# Patient Record
Sex: Female | Born: 1960 | Race: White | Hispanic: No | Smoking: Never smoker
Health system: Southern US, Community
[De-identification: ages and names within clinical notes are randomized; demographics above are authoritative.]

## PROBLEM LIST (undated history)

## (undated) DIAGNOSIS — M810 Age-related osteoporosis without current pathological fracture: Secondary | ICD-10-CM

## (undated) DIAGNOSIS — A498 Other bacterial infections of unspecified site: Secondary | ICD-10-CM

## (undated) DIAGNOSIS — H25019 Cortical age-related cataract, unspecified eye: Secondary | ICD-10-CM

## (undated) DIAGNOSIS — M349 Systemic sclerosis, unspecified: Secondary | ICD-10-CM

## (undated) DIAGNOSIS — I7300 Raynaud's syndrome without gangrene: Secondary | ICD-10-CM

## (undated) DIAGNOSIS — J449 Chronic obstructive pulmonary disease, unspecified: Secondary | ICD-10-CM

## (undated) DIAGNOSIS — C801 Malignant (primary) neoplasm, unspecified: Secondary | ICD-10-CM

## (undated) DIAGNOSIS — M419 Scoliosis, unspecified: Secondary | ICD-10-CM

## (undated) DIAGNOSIS — C44329 Squamous cell carcinoma of skin of other parts of face: Secondary | ICD-10-CM

## (undated) DIAGNOSIS — I739 Peripheral vascular disease, unspecified: Secondary | ICD-10-CM

## (undated) DIAGNOSIS — J9610 Chronic respiratory failure, unspecified whether with hypoxia or hypercapnia: Secondary | ICD-10-CM

## (undated) DIAGNOSIS — F32A Depression, unspecified: Secondary | ICD-10-CM

## (undated) DIAGNOSIS — F419 Anxiety disorder, unspecified: Secondary | ICD-10-CM

## (undated) DIAGNOSIS — F329 Major depressive disorder, single episode, unspecified: Secondary | ICD-10-CM

## (undated) DIAGNOSIS — M1990 Unspecified osteoarthritis, unspecified site: Secondary | ICD-10-CM

## (undated) HISTORY — DX: Major depressive disorder, single episode, unspecified: F32.9

## (undated) HISTORY — DX: Chronic obstructive pulmonary disease, unspecified: J44.9

## (undated) HISTORY — DX: Unspecified osteoarthritis, unspecified site: M19.90

## (undated) HISTORY — DX: Scoliosis, unspecified: M41.9

## (undated) HISTORY — PX: SHOULDER ARTHROSCOPY: SHX128

## (undated) HISTORY — DX: Peripheral vascular disease, unspecified: I73.9

## (undated) HISTORY — DX: Depression, unspecified: F32.A

## (undated) HISTORY — DX: Chronic respiratory failure, unspecified whether with hypoxia or hypercapnia: J96.10

## (undated) HISTORY — DX: Raynaud's syndrome without gangrene: I73.00

## (undated) HISTORY — PX: OTHER SURGICAL HISTORY: SHX169

## (undated) HISTORY — PX: TOOTH EXTRACTION: SUR596

## (undated) HISTORY — DX: Anxiety disorder, unspecified: F41.9

---

## 2000-12-07 ENCOUNTER — Other Ambulatory Visit: Admission: RE | Admit: 2000-12-07 | Discharge: 2000-12-07 | Payer: Self-pay | Admitting: *Deleted

## 2007-10-25 ENCOUNTER — Emergency Department: Payer: Self-pay | Admitting: Emergency Medicine

## 2007-11-02 ENCOUNTER — Emergency Department: Payer: Self-pay | Admitting: Unknown Physician Specialty

## 2013-04-03 ENCOUNTER — Ambulatory Visit: Payer: Self-pay

## 2015-07-30 ENCOUNTER — Ambulatory Visit (INDEPENDENT_AMBULATORY_CARE_PROVIDER_SITE_OTHER): Payer: PRIVATE HEALTH INSURANCE | Admitting: Sports Medicine

## 2015-07-30 ENCOUNTER — Encounter: Payer: Self-pay | Admitting: Sports Medicine

## 2015-07-30 ENCOUNTER — Ambulatory Visit: Payer: Self-pay | Admitting: Sports Medicine

## 2015-07-30 DIAGNOSIS — L603 Nail dystrophy: Secondary | ICD-10-CM | POA: Diagnosis not present

## 2015-07-30 DIAGNOSIS — M79675 Pain in left toe(s): Secondary | ICD-10-CM | POA: Diagnosis not present

## 2015-07-30 NOTE — Patient Instructions (Signed)

## 2015-07-30 NOTE — Progress Notes (Signed)
Patient ID: Sophia Bean, female   DOB: Aug 26, 1960, 55 y.o.   MRN: [id number]  Subjective: Sophia Bean is a 55 y.o. female patient presents to office today complaining of a painful thickened left big toenail that is painful in nature and severely deformed secondary to stubbing the toe multiple years ago and also on hitting the toe in shoe. Patient has tried trimming with no improvement. Patient denies signs of infection or drainage around nail. Patient denies fever/chills/nausea/vomitting/any other related constitutional symptoms at this time.  There are no active problems to display for this patient.   No current outpatient prescriptions on file prior to visit.   No current facility-administered medications on file prior to visit.    Allergies  Allergen Reactions  . Morphine And Related   . Penicillins     Objective:  There were no vitals filed for this visit.  General: Well developed, nourished, in no acute distress, alert and oriented x3   Dermatology: Skin is warm, dry and supple bilateral.Left hallux nail appears to be severely thickened with multiple trauma lines and discoloration with a ram's horn appearance of the nail. (-) Erythema. (-) Edema. (-) serosanguous  drainage present. The remaining nails appear unremarkable at this time. There are no open sores, lesions or other signs of infection present.  Vascular: Dorsalis Pedis artery 1/4 and Posterior Tibial artery pedal pulses are 2/4 bilateral with immedate capillary fill time. Scant pedal hair growth present. No lower extremity edema. + Varicosities bilateral.   Neruologic: Grossly intact via light touch bilateral.  Musculoskeletal: Tenderness to palpation of the Left hallux nail. Muscular strength within normal limits in all groups bilateral.   Assesement and Plan: Problem List Items Addressed This Visit    None    Visit Diagnoses    Nail dystrophy    -  Primary    Toe pain, left           -Discussed treatment  alternatives and plan of care; Explained permanent/temporary nail avulsion and post procedure course to patient. Patient opts for temporary total removal of left hallux nail. - After a verbal consent, injected 3 ml of a 50:50 mixture of 2% plain  lidocaine and 0.5% plain marcaine in a normal hallux block fashion. Next, a  betadine prep was performed. Anesthesia was tested and found to be appropriate.  The offending Left hallux nail was then incised from the hyponychium to the epinychium. The nail was removed and cleared from the field. The area was then flushed with alcohol and dressed with antibiotic cream and a dry sterile dressing. -Patient was instructed to leave the dressing intact for today and begin soaking  in a weak solution of betadine and water tomorrow. Patient was instructed to  soak for 15 minutes each day and apply neosporin and a gauze or bandaid dressing each day. -Patient was instructed to monitor the toe for signs of infection and return to office if toe becomes red, hot or swollen. -Patient is to return in 1 week for follow up care or sooner if problems arise.  Landis Martins, DPM

## 2015-08-06 ENCOUNTER — Ambulatory Visit (INDEPENDENT_AMBULATORY_CARE_PROVIDER_SITE_OTHER): Payer: Medicare Other | Admitting: Sports Medicine

## 2015-08-06 ENCOUNTER — Encounter: Payer: Self-pay | Admitting: Sports Medicine

## 2015-08-06 DIAGNOSIS — M79675 Pain in left toe(s): Secondary | ICD-10-CM

## 2015-08-06 DIAGNOSIS — L603 Nail dystrophy: Secondary | ICD-10-CM

## 2015-08-06 NOTE — Progress Notes (Signed)
Patient ID: Sophia Bean, female   DOB: 03-Aug-1960, 55 y.o.   MRN: [id number] Subjective: Sophia Bean is a 55 y.o. female patient returns to office today for follow up evaluation after having Left Hallux total temporary nail avulsion performed on 07-30-15. Patient has been soaking using betadine and applying topical antibiotic covered with bandaid daily. Patient denies fever/chills/nausea/vomitting/any other related constitutional symptoms at this time.  There are no active problems to display for this patient.   Current Outpatient Prescriptions on File Prior to Visit  Medication Sig Dispense Refill  . levofloxacin (LEVAQUIN) 500 MG tablet Take 500 mg by mouth daily.  0  . LORazepam (ATIVAN) 0.5 MG tablet Take 0.5 mg by mouth daily as needed.  0  . oxyCODONE-acetaminophen (PERCOCET/ROXICET) 5-325 MG tablet TAKE 1 TABLET BY MOUTH EVERY DAY TO TWICE A DAY AS NEEDED  0   No current facility-administered medications on file prior to visit.    Allergies  Allergen Reactions  . Morphine And Related   . Penicillins     Objective:  General: Well developed, nourished, in no acute distress, alert and oriented x3   Dermatology: Skin is warm, dry and supple bilateral. Left hallux nail bed appears to be clean, dry, with mild granular tissue and surrounding eschar/scab. (-) Erythema. (-) Edema. (-) serosanguous drainage present. The remaining nails appear unremarkable at this time. There are no other lesions or other signs of infection  present.  Neurovascular status: Intact. No lower extremity swelling; No pain with calf compression bilateral.  Musculoskeletal: Decreased tenderness to palpation of the Left hallux nailbed. Muscular strength within normal limits bilateral.   Assesement and Plan: Problem List Items Addressed This Visit    None    Visit Diagnoses    Nail dystrophy    -  Primary    S/P Temp Total Nail Avulsion Left hallux 07-30-15    Toe pain, left           -Examined patient   -Cleansed left hallux nail bed and gently scrubbed with peroxide and  applied antibiotic cream covered with bandaid.  -Discussed plan of care with patient. -Patient to now begin soaking in a weak solution of Epsom salt and warm water. Patient was instructed to soak for 15-20 minutes each day until the toe appears normal and there is no drainage, redness, tenderness, or swelling at the procedure site, and apply neosporin and a gauze or bandaid dressing each day as needed. May leave open to air at night. -Educated patient on long term care after nail surgery. -Patient was instructed to monitor the toe for reoccurrence and signs of infection; Patient advised to return to office if toe becomes red, hot or swollen. -Patient is to return as needed or sooner if problems arise.  Sophia Bean, DPM

## 2015-08-12 ENCOUNTER — Other Ambulatory Visit: Payer: Self-pay | Admitting: Internal Medicine

## 2015-08-12 DIAGNOSIS — Z1231 Encounter for screening mammogram for malignant neoplasm of breast: Secondary | ICD-10-CM

## 2015-08-26 ENCOUNTER — Ambulatory Visit: Payer: Self-pay

## 2015-08-27 ENCOUNTER — Other Ambulatory Visit: Payer: Self-pay | Admitting: Internal Medicine

## 2015-08-27 ENCOUNTER — Ambulatory Visit
Admission: RE | Admit: 2015-08-27 | Discharge: 2015-08-27 | Disposition: A | Discharge status: Home / Self Care | Payer: Medicare Other | Source: Ambulatory Visit | Attending: Internal Medicine | Admitting: Internal Medicine

## 2015-08-27 DIAGNOSIS — Z1231 Encounter for screening mammogram for malignant neoplasm of breast: Secondary | ICD-10-CM

## 2015-08-27 HISTORY — DX: Malignant (primary) neoplasm, unspecified: C80.1

## 2015-08-31 ENCOUNTER — Other Ambulatory Visit: Payer: Self-pay | Admitting: Internal Medicine

## 2015-08-31 DIAGNOSIS — R928 Other abnormal and inconclusive findings on diagnostic imaging of breast: Secondary | ICD-10-CM

## 2015-09-10 ENCOUNTER — Ambulatory Visit
Admission: RE | Admit: 2015-09-10 | Discharge: 2015-09-10 | Disposition: A | Discharge status: Home / Self Care | Payer: Medicare Other | Source: Ambulatory Visit | Attending: Internal Medicine | Admitting: Internal Medicine

## 2015-09-10 DIAGNOSIS — R928 Other abnormal and inconclusive findings on diagnostic imaging of breast: Secondary | ICD-10-CM | POA: Diagnosis not present

## 2015-09-10 DIAGNOSIS — N6001 Solitary cyst of right breast: Secondary | ICD-10-CM | POA: Insufficient documentation

## 2016-07-18 ENCOUNTER — Other Ambulatory Visit: Payer: Self-pay | Admitting: Internal Medicine

## 2016-07-18 DIAGNOSIS — M341 CR(E)ST syndrome: Secondary | ICD-10-CM

## 2016-07-27 ENCOUNTER — Ambulatory Visit
Admission: RE | Admit: 2016-07-27 | Discharge: 2016-07-27 | Disposition: A | Discharge status: Home / Self Care | Payer: Medicare Other | Source: Ambulatory Visit | Attending: Internal Medicine | Admitting: Internal Medicine

## 2016-07-27 DIAGNOSIS — M3481 Systemic sclerosis with lung involvement: Secondary | ICD-10-CM | POA: Diagnosis not present

## 2016-07-27 DIAGNOSIS — M341 CR(E)ST syndrome: Secondary | ICD-10-CM | POA: Diagnosis not present

## 2016-07-27 DIAGNOSIS — K769 Liver disease, unspecified: Secondary | ICD-10-CM | POA: Diagnosis not present

## 2016-07-27 DIAGNOSIS — J849 Interstitial pulmonary disease, unspecified: Secondary | ICD-10-CM | POA: Diagnosis not present

## 2016-07-27 DIAGNOSIS — I700 Atherosclerosis of aorta: Secondary | ICD-10-CM | POA: Insufficient documentation

## 2016-07-27 DIAGNOSIS — J439 Emphysema, unspecified: Secondary | ICD-10-CM | POA: Insufficient documentation

## 2016-08-02 ENCOUNTER — Other Ambulatory Visit: Payer: Self-pay | Admitting: Internal Medicine

## 2016-08-02 DIAGNOSIS — K769 Liver disease, unspecified: Secondary | ICD-10-CM

## 2016-08-04 ENCOUNTER — Ambulatory Visit
Admission: RE | Admit: 2016-08-04 | Discharge: 2016-08-04 | Disposition: A | Discharge status: Home / Self Care | Payer: Medicare Other | Source: Ambulatory Visit | Attending: Internal Medicine | Admitting: Internal Medicine

## 2016-08-04 DIAGNOSIS — K769 Liver disease, unspecified: Secondary | ICD-10-CM | POA: Diagnosis present

## 2016-08-04 DIAGNOSIS — K824 Cholesterolosis of gallbladder: Secondary | ICD-10-CM | POA: Diagnosis not present

## 2016-08-14 ENCOUNTER — Other Ambulatory Visit: Payer: Self-pay | Admitting: Internal Medicine

## 2016-08-14 DIAGNOSIS — Z1231 Encounter for screening mammogram for malignant neoplasm of breast: Secondary | ICD-10-CM

## 2016-09-14 ENCOUNTER — Ambulatory Visit: Payer: Medicare Other

## 2016-11-02 ENCOUNTER — Ambulatory Visit
Admission: RE | Admit: 2016-11-02 | Discharge: 2016-11-02 | Disposition: A | Discharge status: Home / Self Care | Payer: Medicare Other | Source: Ambulatory Visit | Attending: Internal Medicine | Admitting: Internal Medicine

## 2016-11-02 DIAGNOSIS — Z1231 Encounter for screening mammogram for malignant neoplasm of breast: Secondary | ICD-10-CM | POA: Diagnosis not present

## 2017-02-19 ENCOUNTER — Other Ambulatory Visit: Payer: Self-pay | Admitting: Student

## 2017-02-19 DIAGNOSIS — D1803 Hemangioma of intra-abdominal structures: Secondary | ICD-10-CM

## 2017-02-27 ENCOUNTER — Ambulatory Visit
Admission: RE | Admit: 2017-02-27 | Discharge: 2017-02-27 | Disposition: A | Discharge status: Home / Self Care | Payer: Medicare Other | Source: Ambulatory Visit | Attending: Student | Admitting: Student

## 2017-02-27 DIAGNOSIS — D1803 Hemangioma of intra-abdominal structures: Secondary | ICD-10-CM | POA: Diagnosis not present

## 2017-02-27 MED ORDER — GADOBENATE DIMEGLUMINE 529 MG/ML IV SOLN
13.0000 mL | Freq: Once | INTRAVENOUS | Status: AC | PRN
  Administered 2017-02-27: 13 mL via INTRAVENOUS

## 2017-06-26 ENCOUNTER — Encounter (INDEPENDENT_AMBULATORY_CARE_PROVIDER_SITE_OTHER): Payer: Medicare Other | Admitting: Vascular Surgery

## 2017-07-09 ENCOUNTER — Encounter (INDEPENDENT_AMBULATORY_CARE_PROVIDER_SITE_OTHER): Payer: Self-pay | Admitting: Vascular Surgery

## 2017-07-09 ENCOUNTER — Ambulatory Visit (INDEPENDENT_AMBULATORY_CARE_PROVIDER_SITE_OTHER): Payer: Medicare Other | Admitting: Vascular Surgery

## 2017-07-09 VITALS — BP 116/66 | HR 95 | Resp 16 | Ht 67.0 in | Wt 135.0 lb

## 2017-07-09 DIAGNOSIS — L97309 Non-pressure chronic ulcer of unspecified ankle with unspecified severity: Secondary | ICD-10-CM

## 2017-07-09 DIAGNOSIS — I83003 Varicose veins of unspecified lower extremity with ulcer of ankle: Secondary | ICD-10-CM | POA: Insufficient documentation

## 2017-07-09 DIAGNOSIS — R600 Localized edema: Secondary | ICD-10-CM | POA: Insufficient documentation

## 2017-07-09 DIAGNOSIS — I83813 Varicose veins of bilateral lower extremities with pain: Secondary | ICD-10-CM | POA: Diagnosis not present

## 2017-07-09 NOTE — Progress Notes (Signed)
Subjective:    Patient ID: Sophia Bean, female    DOB: 03/20/1961, 57 y.o.   MRN: [id number] Chief Complaint  Patient presents with  . New Patient (Initial Visit)    PVD   Presents as a new patient referred by Dr. Renard Hamper for evaluation of venous insufficiency.  The patient endorses a history of being diagnosed with bilateral venous insufficiency in 2016 by a physician at [facility].  Since that time, the patient has been experiencing ulcerations located to the bilateral medial ankles on an intermittent basis.  The patient treats these "ulcerations" with silvadene.  This seems to work well.  The patient also wears 15-10mmhg knee-high compression stockings on a daily basis.  The patient also elevates her legs heart level or higher.  Patient has a past medical history of crest syndrome.  The patient endorses a hard to heal incisions and wounds.  The patient is looking to establish care at a practice which is closer than [facility].  The patient also experiences mild bilateral lower extremity edema.  The patient notes discomfort along her varicosities.  Her symptoms have progressed to the point she is unable to function on a daily basis.  Her symptoms have become lifestyle limiting.  The patient is worried if she develops any additional/worsening ulcerations due to her crest syndrome she will not be able to heal them.  The patient denies any recent trauma surgery to the bilateral lower extremity.  The patient denies any DVT history to the bilateral lower extremity.  The patient denies any active ulceration to the bilateral lower extremity.  The patient denies any fever, nausea vomiting.   Review of Systems  Constitutional: Negative.   HENT: Negative.   Eyes: Negative.   Respiratory: Negative.   Cardiovascular: Positive for leg swelling.       Painful varicose veins Venous ulceration  Gastrointestinal: Negative.   Endocrine: Negative.   Genitourinary: Negative.   Musculoskeletal: Negative.     Skin: Negative.   Allergic/Immunologic: Negative.   Neurological: Negative.   Hematological: Negative.   Psychiatric/Behavioral: Negative.       Objective:   Physical Exam  Constitutional: She is oriented to person, place, and time. She appears well-developed and well-nourished. No distress.  HENT:  Head: Normocephalic and atraumatic.  Eyes: Conjunctivae are normal. Pupils are equal, round, and reactive to light.  Neck: Normal range of motion.  Cardiovascular: Normal rate, regular rhythm, normal heart sounds and intact distal pulses.  Pulses:      Radial pulses are 2+ on the right side, and 2+ on the left side.       Dorsalis pedis pulses are 1+ on the right side, and 1+ on the left side.       Posterior tibial pulses are 2+ on the right side, and 2+ on the left side.  Pulmonary/Chest: Effort normal and breath sounds normal.  Musculoskeletal: Normal range of motion. She exhibits edema (Minimal to mild nonpitting bilateral edema noted).  Neurological: She is alert and oriented to person, place, and time.  Skin: She is not diaphoretic.  There is severe stasis dermatitis noted to the patient's bilateral medial ankle area.  There is no active cellulitis.  There is no active ulcerations.  Is a mixture of greater than 1 cm and less than 1 cm varicosities noted to the bilateral lower extremity  Psychiatric: She has a normal mood and affect. Her behavior is normal. Judgment and thought content normal.  Vitals reviewed.  BP 116/66 (BP  Location: Right Arm, Patient Position: Sitting)   Pulse 95   Resp 16   Ht 5\' 7"  (1.702 m)   Wt 135 lb (61.2 kg)   LMP  (LMP Unknown)   BMI 21.14 kg/m   Past Medical History:  Diagnosis Date  . Anxiety   . Arthritis   . Cancer ([place])    skin  . Chronic respiratory failure ([place])   . COPD (chronic obstructive pulmonary disease) ([place])   . Depression   . Peripheral vascular disease ([place])   . Raynaud disease   . Scoliosis    Social History    Socioeconomic History  . Marital status: Divorced    Spouse name: Not on file  . Number of children: Not on file  . Years of education: Not on file  . Highest education level: Not on file  Social Needs  . Financial resource strain: Not on file  . Food insecurity - worry: Not on file  . Food insecurity - inability: Not on file  . Transportation needs - medical: Not on file  . Transportation needs - non-medical: Not on file  Occupational History  . Not on file  Tobacco Use  . Smoking status: Never Smoker  . Smokeless tobacco: Never Used  Substance and Sexual Activity  . Alcohol use: No    Alcohol/week: 0.0 oz    Frequency: Never  . Drug use: Not on file  . Sexual activity: Not on file  Other Topics Concern  . Not on file  Social History Narrative  . Not on file   Past Surgical History:  Procedure Laterality Date  . mohs Right   . SHOULDER ARTHROSCOPY     times 9   Family History  Problem Relation Age of Onset  . Heart disease Mother   . Hypertension Mother   . Cancer Mother   . Cancer Father   . Cancer Maternal Grandmother   . Heart disease Maternal Grandmother   . Breast cancer Neg Hx    Allergies  Allergen Reactions  . Flonase [Fluticasone] Shortness Of Breath and Rash  . Morphine And Related   . Penicillins   . Duloxetine Hcl Nausea Only and Palpitations      Assessment & Plan:  Presents as a new patient referred by Dr. Renard Hamper for evaluation of venous insufficiency.  The patient endorses a history of being diagnosed with bilateral venous insufficiency in 2016 by a physician at [facility].  Since that time, the patient has been experiencing ulcerations located to the bilateral medial ankles on an intermittent basis.  The patient treats these "ulcerations" with silvadene.  This seems to work well.  The patient also wears 15-16mmhg knee-high compression stockings on a daily basis.  The patient also elevates her legs heart level or higher.  Patient has a past  medical history of crest syndrome.  The patient endorses a hard to heal incisions and wounds.  The patient is looking to establish care at a practice which is closer than [facility].  The patient also experiences mild bilateral lower extremity edema.  The patient notes discomfort along her varicosities.  Her symptoms have progressed to the point she is unable to function on a daily basis.  Her symptoms have become lifestyle limiting.  The patient is worried if she develops any additional/worsening ulcerations due to her crest syndrome she will not be able to heal them.  The patient denies any recent trauma surgery to the bilateral lower extremity.  The patient denies  any DVT history to the bilateral lower extremity.  The patient denies any active ulceration to the bilateral lower extremity.  The patient denies any fever, nausea vomiting.  1. Varicose veins of both lower extremities with pain - New Patient has a past medical history of being diagnosed in 2016 with bilateral venous insufficiency by physician at [facility]. I think it would benefit the patient to increase her medical compression stockings to 20-70mmhg Patient to place her stockings in the a.m. and remove them in the p.m.  There is no need to sleep in the stockings. Patient was encouraged to elevate her legs heart level or higher I will bring the patient back in approximately 1 month to undergo a bilateral venous duplex to assess her anatomy and degree of venous insufficiency At that time, I can assess her progress with medical grade 1 compression stockings and elevation. We can always discussed the addition of a lymphedema pump at that time.  - VAS [place] LOWER EXTREMITY VENOUS REFLUX; Future  2. Venous stasis ulcer of ankle with varicose veins, unspecified laterality, unspecified ulcer stage ([place]) - New The patient does not have any active ulcerations at this time however has dealt with them in the past I will order an ABI to assess the  patient's arterial blood flow  - VAS [place] ABI WITH/WO TBI; Future  3. Bilateral lower extremity edema  - New As above  Current Outpatient Medications on File Prior to Visit  Medication Sig Dispense Refill  . alendronate (FOSAMAX) 70 MG tablet Take by mouth.    . Calcium Carbonate-Vitamin D (CALCIUM HIGH POTENCY/VITAMIN D) 600-200 MG-UNIT TABS Take by mouth.    . Cholecalciferol (VITAMIN D-1000 MAX ST) 1000 units tablet Take by mouth.    . gabapentin (NEURONTIN) 300 MG capsule TAKE 1 CAPSULE BY MOUTH EVERY 12 HOURS  2  . gentamicin ointment (GARAMYCIN) 0.1 % Apply topically.    Marland Kitchen LORazepam (ATIVAN) 0.5 MG tablet Take 0.5 mg by mouth daily as needed.  0  . mycophenolate (CELLCEPT) 500 MG tablet Take by mouth.    . nitroGLYCERIN (NITRO-BID) 2 % ointment Place onto the skin.    Marland Kitchen oxyCODONE-acetaminophen (PERCOCET/ROXICET) 5-325 MG tablet TAKE 1 TABLET BY MOUTH EVERY DAY TO TWICE A DAY AS NEEDED  0  . ranitidine (ZANTAC) 150 MG tablet Take by mouth.    . silver sulfADIAZINE (SILVADENE) 1 % cream Apply topically.    Marland Kitchen levofloxacin (LEVAQUIN) 500 MG tablet Take 500 mg by mouth daily.  0   No current facility-administered medications on file prior to visit.    There are no Patient Instructions on file for this visit. No Follow-up on file.  Jmya Uliano A Laurine Kuyper, PA-C

## 2017-07-10 ENCOUNTER — Telehealth (INDEPENDENT_AMBULATORY_CARE_PROVIDER_SITE_OTHER): Payer: Self-pay | Admitting: Vascular Surgery

## 2017-07-10 NOTE — Telephone Encounter (Signed)
New Message  Pt was here to see KS and she told KS the wrong compression about her stockings.  Pt verbalized she is suppose to have or be in 30-40 the next size up.

## 2017-07-10 NOTE — Telephone Encounter (Signed)
I spoke with the patient and she will be coming by the office to receive a new rx for 30-21mmHg compressions stocking

## 2017-07-31 ENCOUNTER — Other Ambulatory Visit: Payer: Self-pay | Admitting: Student

## 2017-07-31 ENCOUNTER — Ambulatory Visit
Admission: RE | Admit: 2017-07-31 | Discharge: 2017-07-31 | Disposition: A | Discharge status: Home / Self Care | Payer: Medicare Other | Source: Ambulatory Visit | Attending: Student | Admitting: Student

## 2017-07-31 DIAGNOSIS — K219 Gastro-esophageal reflux disease without esophagitis: Secondary | ICD-10-CM

## 2017-08-06 ENCOUNTER — Ambulatory Visit (INDEPENDENT_AMBULATORY_CARE_PROVIDER_SITE_OTHER): Payer: Medicare Other | Admitting: Vascular Surgery

## 2017-08-06 ENCOUNTER — Encounter (INDEPENDENT_AMBULATORY_CARE_PROVIDER_SITE_OTHER): Payer: Medicare Other

## 2017-08-07 ENCOUNTER — Ambulatory Visit
Admission: RE | Admit: 2017-08-07 | Discharge: 2017-08-07 | Disposition: A | Discharge status: Home / Self Care | Payer: Medicare Other | Source: Ambulatory Visit | Attending: Student | Admitting: Student

## 2017-08-07 DIAGNOSIS — K219 Gastro-esophageal reflux disease without esophagitis: Secondary | ICD-10-CM | POA: Diagnosis present

## 2017-08-29 ENCOUNTER — Ambulatory Visit (INDEPENDENT_AMBULATORY_CARE_PROVIDER_SITE_OTHER): Payer: Medicare Other | Admitting: Vascular Surgery

## 2017-08-29 ENCOUNTER — Encounter (INDEPENDENT_AMBULATORY_CARE_PROVIDER_SITE_OTHER): Payer: Medicare Other

## 2017-09-10 ENCOUNTER — Other Ambulatory Visit: Payer: Self-pay | Admitting: Nurse Practitioner

## 2017-09-10 ENCOUNTER — Other Ambulatory Visit (HOSPITAL_COMMUNITY): Payer: Self-pay | Admitting: Anesthesiology

## 2017-09-10 DIAGNOSIS — M545 Low back pain: Secondary | ICD-10-CM

## 2017-09-13 ENCOUNTER — Ambulatory Visit (HOSPITAL_COMMUNITY)
Admission: RE | Admit: 2017-09-13 | Discharge: 2017-09-13 | Disposition: A | Discharge status: Home / Self Care | Payer: Medicare Other | Source: Ambulatory Visit | Attending: Nurse Practitioner | Admitting: Nurse Practitioner

## 2017-09-13 DIAGNOSIS — G893 Neoplasm related pain (acute) (chronic): Secondary | ICD-10-CM | POA: Insufficient documentation

## 2017-09-13 DIAGNOSIS — M48061 Spinal stenosis, lumbar region without neurogenic claudication: Secondary | ICD-10-CM | POA: Diagnosis not present

## 2017-09-13 DIAGNOSIS — M545 Low back pain: Secondary | ICD-10-CM | POA: Diagnosis present

## 2017-09-17 ENCOUNTER — Other Ambulatory Visit (INDEPENDENT_AMBULATORY_CARE_PROVIDER_SITE_OTHER): Payer: Self-pay | Admitting: Vascular Surgery

## 2017-09-17 ENCOUNTER — Ambulatory Visit (INDEPENDENT_AMBULATORY_CARE_PROVIDER_SITE_OTHER): Payer: Medicare Other

## 2017-09-17 ENCOUNTER — Encounter (INDEPENDENT_AMBULATORY_CARE_PROVIDER_SITE_OTHER): Payer: Medicare Other

## 2017-09-17 ENCOUNTER — Ambulatory Visit (INDEPENDENT_AMBULATORY_CARE_PROVIDER_SITE_OTHER): Payer: Medicare Other | Admitting: Vascular Surgery

## 2017-09-17 ENCOUNTER — Encounter (INDEPENDENT_AMBULATORY_CARE_PROVIDER_SITE_OTHER): Payer: Self-pay | Admitting: Vascular Surgery

## 2017-09-17 VITALS — BP 101/62 | HR 71 | Resp 16 | Ht 67.0 in | Wt 125.0 lb

## 2017-09-17 DIAGNOSIS — I739 Peripheral vascular disease, unspecified: Secondary | ICD-10-CM

## 2017-09-17 DIAGNOSIS — I890 Lymphedema, not elsewhere classified: Secondary | ICD-10-CM

## 2017-09-17 DIAGNOSIS — I83003 Varicose veins of unspecified lower extremity with ulcer of ankle: Secondary | ICD-10-CM | POA: Diagnosis not present

## 2017-09-17 DIAGNOSIS — R0989 Other specified symptoms and signs involving the circulatory and respiratory systems: Secondary | ICD-10-CM

## 2017-09-17 DIAGNOSIS — I872 Venous insufficiency (chronic) (peripheral): Secondary | ICD-10-CM

## 2017-09-17 DIAGNOSIS — L97309 Non-pressure chronic ulcer of unspecified ankle with unspecified severity: Secondary | ICD-10-CM

## 2017-09-17 DIAGNOSIS — I83813 Varicose veins of bilateral lower extremities with pain: Secondary | ICD-10-CM | POA: Diagnosis not present

## 2017-09-17 DIAGNOSIS — R600 Localized edema: Secondary | ICD-10-CM

## 2017-09-23 ENCOUNTER — Encounter (INDEPENDENT_AMBULATORY_CARE_PROVIDER_SITE_OTHER): Payer: Self-pay | Admitting: Vascular Surgery

## 2017-09-23 DIAGNOSIS — I890 Lymphedema, not elsewhere classified: Secondary | ICD-10-CM | POA: Insufficient documentation

## 2017-09-23 DIAGNOSIS — I872 Venous insufficiency (chronic) (peripheral): Secondary | ICD-10-CM | POA: Insufficient documentation

## 2017-09-23 NOTE — Progress Notes (Signed)
MRN : [id number]  Sophia Bean is a 57 y.o. (February 09, 1961) female who presents with chief complaint of  Chief Complaint  Patient presents with  . Follow-up    1 month venous reflux, ABI  .  History of Present Illness: Patient is seen for follow up evaluation of leg pain and swelling associated with venous stasis disease.  The swelling abruptly became much worse bilaterally and is associated with pain and discoloration. The pain and swelling worsens with prolonged dependency and improves with elevation.  The patient notes that in the morning the legs are better but the leg symptoms worsened throughout the course of the day. The patient has also noted a progressive worsening of the discoloration in the ankle and shin area.   The patient notes that the discoloration has developed slowly without specific trauma.    The patient states that they have been elevating as much as possible. The patient denies any recent changes in medications.  The patient denies a history of DVT or PE. There is no prior history of phlebitis. There is no history of primary lymphedema.  No SOB or increased cough.  No sputum production.  No recent episodes of CHF exacerbation.   Current Meds  Medication Sig  . amlodipine-atorvastatin (CADUET) 10-20 MG tablet Take 1 tablet by mouth daily.  . Calcium Carbonate-Vitamin D (CALCIUM HIGH POTENCY/VITAMIN D) 600-200 MG-UNIT TABS Take by mouth.  . Cholecalciferol (VITAMIN D-1000 MAX ST) 1000 units tablet Take by mouth.  . fentaNYL (DURAGESIC - DOSED MCG/HR) 12 MCG/HR APPLY 1 PATCH EVERY 48 HOURS  . gabapentin (NEURONTIN) 300 MG capsule TAKE 1 CAPSULE BY MOUTH EVERY 12 HOURS  . mycophenolate (CELLCEPT) 500 MG tablet Take by mouth.  . pantoprazole (PROTONIX) 40 MG tablet TAKE 1 TABLET ORALLY DAILY IN THE AM 1 HOUR PRIOR TO BREAKFAST  . ranitidine (ZANTAC) 150 MG tablet Take by mouth.  . zoledronic acid (RECLAST) 5 MG/100ML SOLN injection Inject 5 mg into the vein once.     Past Medical History:  Diagnosis Date  . Anxiety   . Arthritis   . Cancer ([place])    skin  . Chronic respiratory failure ([place])   . COPD (chronic obstructive pulmonary disease) ([place])   . Depression   . Peripheral vascular disease ([place])   . Raynaud disease   . Scoliosis     Past Surgical History:  Procedure Laterality Date  . mohs Right   . SHOULDER ARTHROSCOPY     times 9    Social History Social History   Tobacco Use  . Smoking status: Never Smoker  . Smokeless tobacco: Never Used  Substance Use Topics  . Alcohol use: No    Alcohol/week: 0.0 oz    Frequency: Never  . Drug use: Not on file    Family History Family History  Problem Relation Age of Onset  . Heart disease Mother   . Hypertension Mother   . Cancer Mother   . Cancer Father   . Cancer Maternal Grandmother   . Heart disease Maternal Grandmother   . Breast cancer Neg Hx   No family history of bleeding/clotting disorders, porphyria or autoimmune disease   Allergies  Allergen Reactions  . Flonase [Fluticasone] Shortness Of Breath and Rash  . Minocycline     Other reaction(s): Other (See Comments) She has not been able to eat, has had severe acid reflux, has had some chest pain, and has also had breathing problems.  . Morphine And Related   .  Penicillins   . Duloxetine Hcl Nausea Only and Palpitations     REVIEW OF SYSTEMS (Negative unless checked)  Constitutional: [] Weight loss  [] Fever  [] Chills Cardiac: [] Chest pain   [] Chest pressure   [] Palpitations   [] Shortness of breath when laying flat   [] Shortness of breath with exertion. Vascular:  [] Pain in legs with walking   [] Pain in legs at rest  [] History of DVT   [] Phlebitis   [x] Swelling in legs   [x] Varicose veins   [] Non-healing ulcers Pulmonary:   [] Uses home oxygen   [] Productive cough   [] Hemoptysis   [] Wheeze  [] COPD   [] Asthma Neurologic:  [] Dizziness   [] Seizures   [] History of stroke   [] History of TIA  [] Aphasia   [] Vissual  changes   [] Weakness or numbness in arm   [] Weakness or numbness in leg Musculoskeletal:   [] Joint swelling   [] Joint pain   [] Low back pain Hematologic:  [] Easy bruising  [] Easy bleeding   [] Hypercoagulable state   [] Anemic Gastrointestinal:  [] Diarrhea   [] Vomiting  [] Gastroesophageal reflux/heartburn   [] Difficulty swallowing. Genitourinary:  [] Chronic kidney disease   [] Difficult urination  [] Frequent urination   [] Blood in urine Skin:  [x] Rashes   [x] Ulcers  Psychological:  [] History of anxiety   []  History of major depression.  Physical Examination  Vitals:   09/17/17 1458  BP: 101/62  Pulse: 71  Resp: 16  Weight: 125 lb (56.7 kg)  Height: 5\' 7"  (1.702 m)   Body mass index is 19.58 kg/m. Gen: WD/WN, NAD Head: [place]/AT, No temporalis wasting.  Ear/Nose/Throat: Hearing grossly intact, nares w/o erythema or drainage, poor dentition Eyes: PER, EOMI, sclera nonicteric.  Neck: Supple, no masses.  No bruit or JVD.  Pulmonary:  Good air movement, clear to auscultation bilaterally, no use of accessory muscles.  Cardiac: RRR, normal S1, S2, no Murmurs. Vascular: Large varicosities present extensively greater than 10 mm bilateral legs.  Severe venous stasis changes to the legs bilaterally.  4+ soft pitting edema Vessel Right Left  Radial Palpable Palpable  PT Palpable Palpable  DP Palpable Palpable  Gastrointestinal: soft, non-distended. No guarding/no peritoneal signs.  Musculoskeletal: M/S 5/5 throughout.  No deformity or atrophy.  Neurologic: CN 2-12 intact. Pain and light touch intact in extremities.  Symmetrical.  Speech is fluent. Motor exam as listed above. Psychiatric: Judgment intact, Mood & affect appropriate for pt's clinical situation. Dermatologic: Severe rashes no ulcers noted.  No changes consistent with cellulitis. Lymph : No Cervical lymphadenopathy, no lichenification or skin changes of chronic lymphedema.  CBC No results found for: WBC, HGB, HCT, MCV,  PLT  BMET No results found for: NA, K, CL, CO2, GLUCOSE, BUN, CREATININE, CALCIUM, GFRNONAA, GFRAA CrCl cannot be calculated (No order found.).  COAG No results found for: INR, PROTIME  Radiology Mr Lumbar Spine Wo Contrast  Result Date: 09/13/2017 CLINICAL DATA:  57 y/o F; lower back pain radiating down into the left hip and buttocks area. EXAM: MRI LUMBAR SPINE WITHOUT CONTRAST TECHNIQUE: Multiplanar, multisequence MR imaging of the lumbar spine was performed. No intravenous contrast was administered. COMPARISON:  None. FINDINGS: Segmentation:  Standard. Alignment: Mild lumbar levocurvature with apex at L3. Normal lumbar lordosis without listhesis. Vertebrae:  No fracture, evidence of discitis, or bone lesion. Conus medullaris and cauda equina: Conus extends to the L2 level. Conus and cauda equina appear normal. Small S2 Tarlov cyst. Paraspinal and other soft tissues: Negative. Disc levels: L1-2: No significant disc displacement, foraminal stenosis, or canal stenosis. L2-3:  Mild disc bulge.  No significant foraminal or canal stenosis. L3-4:  Mild disc bulge.  No significant foraminal or canal stenosis. L4-5: Small disc bulge eccentric to the left with small left foraminal annular fissure. Mild left foraminal stenosis. No right foraminal or canal stenosis. L5-S1: No significant disc displacement, foraminal stenosis, or canal stenosis. IMPRESSION: 1. No acute osseous abnormality. 2. Mild lumbar spine levocurvature apex at L3. 3. Mild discogenic degenerative changes greatest at L4-5 level where an eccentric disc bulge results in mild left-sided foraminal stenosis. No significant canal stenosis. Electronically Signed   By: Kristine Garbe M.D.   On: 09/13/2017 20:39     Assessment/Plan  1. Chronic venous insufficiency No surgery or intervention at this point in time.    I have had a long discussion with the patient regarding venous insufficiency and why it  causes symptoms. I have  discussed with the patient the chronic skin changes that accompany venous insufficiency and the long term sequela such as infection and ulceration.  Patient will begin wearing graduated compression stockings class 1 (20-30 mmHg) or compression wraps on a daily basis a prescription was given. The patient will put the stockings on first thing in the morning and removing them in the evening. The patient is instructed specifically not to sleep in the stockings.    In addition, behavioral modification including several periods of elevation of the lower extremities during the day will be continued. I have demonstrated that proper elevation is a position with the ankles at heart level.  The patient is instructed to begin routine exercise, especially walking on a daily basis  2. Bilateral lower extremity edema No surgery or intervention at this point in time.  I have reviewed my discussion with the patient regarding venous insufficiency and why it causes symptoms. I have discussed with the patient the chronic skin changes that accompany venous insufficiency and the long term sequela such as ulceration. Patient will contnue wearing graduated compression stockings on a daily basis, as this has provided excellent control of his edema. The patient will put the stockings on first thing in the morning and removing them in the evening. The patient is reminded not to sleep in the stockings.  In addition, behavioral modification including elevation during the day will be initiated. Exercise is strongly encouraged.  Duplex ultrasound of the bilateral lower extremity shows significant deep and superficial insufficiency   Given the patient's good control and lack of any problems regarding the venous insufficiency and lymphedema a lymph pump in not need at this time.  The patient will follow up with me PRN should anything change.  The patient voices agreement with this plan.   3. Lymphedema See #2  4. PAD  (peripheral artery disease) (HCC) Recommend:  I do not find evidence of life style limiting vascular disease. The patient specifically denies life style limitation.  Previous noninvasive studies including ABI's of the legs do not identify critical vascular problems.  The patient should continue walking and begin a more formal exercise program. The patient should continue his antiplatelet therapy and aggressive treatment of the lipid abnormalities.  The patient should begin wearing graduated compression socks 15-20 mmHg strength to control her mild edema.  Patient will follow-up with me on a PRN basis    Hortencia Pilar, MD  09/23/2017 2:14 PM

## 2020-01-02 ENCOUNTER — Other Ambulatory Visit: Payer: Self-pay

## 2020-01-02 ENCOUNTER — Inpatient Hospital Stay
Admission: EM | Admit: 2020-01-02 | Discharge: 2020-01-06 | DRG: 371 | Disposition: A | Discharge status: Home / Self Care | Payer: Medicare Other | Attending: Internal Medicine | Admitting: Internal Medicine

## 2020-01-02 ENCOUNTER — Emergency Department: Payer: Medicare Other

## 2020-01-02 DIAGNOSIS — A039 Shigellosis, unspecified: Secondary | ICD-10-CM | POA: Diagnosis present

## 2020-01-02 DIAGNOSIS — Z881 Allergy status to other antibiotic agents status: Secondary | ICD-10-CM

## 2020-01-02 DIAGNOSIS — M419 Scoliosis, unspecified: Secondary | ICD-10-CM | POA: Diagnosis present

## 2020-01-02 DIAGNOSIS — A038 Other shigellosis: Secondary | ICD-10-CM | POA: Diagnosis not present

## 2020-01-02 DIAGNOSIS — M1990 Unspecified osteoarthritis, unspecified site: Secondary | ICD-10-CM | POA: Diagnosis present

## 2020-01-02 DIAGNOSIS — F329 Major depressive disorder, single episode, unspecified: Secondary | ICD-10-CM | POA: Diagnosis present

## 2020-01-02 DIAGNOSIS — M349 Systemic sclerosis, unspecified: Secondary | ICD-10-CM | POA: Diagnosis present

## 2020-01-02 DIAGNOSIS — G8929 Other chronic pain: Secondary | ICD-10-CM | POA: Diagnosis present

## 2020-01-02 DIAGNOSIS — D649 Anemia, unspecified: Secondary | ICD-10-CM | POA: Diagnosis present

## 2020-01-02 DIAGNOSIS — R002 Palpitations: Secondary | ICD-10-CM | POA: Diagnosis present

## 2020-01-02 DIAGNOSIS — Z888 Allergy status to other drugs, medicaments and biological substances status: Secondary | ICD-10-CM

## 2020-01-02 DIAGNOSIS — R000 Tachycardia, unspecified: Secondary | ICD-10-CM | POA: Diagnosis present

## 2020-01-02 DIAGNOSIS — I7300 Raynaud's syndrome without gangrene: Secondary | ICD-10-CM | POA: Diagnosis present

## 2020-01-02 DIAGNOSIS — I959 Hypotension, unspecified: Secondary | ICD-10-CM | POA: Diagnosis present

## 2020-01-02 DIAGNOSIS — F419 Anxiety disorder, unspecified: Secondary | ICD-10-CM | POA: Diagnosis present

## 2020-01-02 DIAGNOSIS — Z880 Allergy status to penicillin: Secondary | ICD-10-CM

## 2020-01-02 DIAGNOSIS — R197 Diarrhea, unspecified: Secondary | ICD-10-CM | POA: Diagnosis not present

## 2020-01-02 DIAGNOSIS — Z885 Allergy status to narcotic agent status: Secondary | ICD-10-CM

## 2020-01-02 DIAGNOSIS — E162 Hypoglycemia, unspecified: Secondary | ICD-10-CM | POA: Diagnosis present

## 2020-01-02 DIAGNOSIS — J849 Interstitial pulmonary disease, unspecified: Secondary | ICD-10-CM | POA: Diagnosis present

## 2020-01-02 DIAGNOSIS — Z9981 Dependence on supplemental oxygen: Secondary | ICD-10-CM

## 2020-01-02 DIAGNOSIS — Z8249 Family history of ischemic heart disease and other diseases of the circulatory system: Secondary | ICD-10-CM

## 2020-01-02 DIAGNOSIS — Z79899 Other long term (current) drug therapy: Secondary | ICD-10-CM

## 2020-01-02 DIAGNOSIS — J9611 Chronic respiratory failure with hypoxia: Secondary | ICD-10-CM | POA: Diagnosis present

## 2020-01-02 DIAGNOSIS — J8489 Other specified interstitial pulmonary diseases: Secondary | ICD-10-CM | POA: Diagnosis present

## 2020-01-02 DIAGNOSIS — E860 Dehydration: Secondary | ICD-10-CM

## 2020-01-02 DIAGNOSIS — D593 Hemolytic-uremic syndrome: Secondary | ICD-10-CM | POA: Diagnosis present

## 2020-01-02 DIAGNOSIS — Z7983 Long term (current) use of bisphosphonates: Secondary | ICD-10-CM

## 2020-01-02 DIAGNOSIS — G894 Chronic pain syndrome: Secondary | ICD-10-CM | POA: Diagnosis present

## 2020-01-02 DIAGNOSIS — I4510 Unspecified right bundle-branch block: Secondary | ICD-10-CM | POA: Diagnosis present

## 2020-01-02 DIAGNOSIS — J449 Chronic obstructive pulmonary disease, unspecified: Secondary | ICD-10-CM | POA: Diagnosis present

## 2020-01-02 DIAGNOSIS — R634 Abnormal weight loss: Secondary | ICD-10-CM | POA: Diagnosis present

## 2020-01-02 DIAGNOSIS — Z20822 Contact with and (suspected) exposure to covid-19: Secondary | ICD-10-CM | POA: Diagnosis present

## 2020-01-02 DIAGNOSIS — Z882 Allergy status to sulfonamides status: Secondary | ICD-10-CM

## 2020-01-02 DIAGNOSIS — B9620 Unspecified Escherichia coli [E. coli] as the cause of diseases classified elsewhere: Secondary | ICD-10-CM | POA: Diagnosis present

## 2020-01-02 DIAGNOSIS — K529 Noninfective gastroenteritis and colitis, unspecified: Secondary | ICD-10-CM

## 2020-01-02 DIAGNOSIS — Z681 Body mass index (BMI) 19 or less, adult: Secondary | ICD-10-CM

## 2020-01-02 LAB — HEPATIC FUNCTION PANEL
ALT: 13 U/L (ref 0–44)
AST: 22 U/L (ref 15–41)
Albumin: 4.4 g/dL (ref 3.5–5.0)
Alkaline Phosphatase: 34 U/L — ABNORMAL LOW (ref 38–126)
Bilirubin, Direct: 0.2 mg/dL (ref 0.0–0.2)
Indirect Bilirubin: 1.7 mg/dL — ABNORMAL HIGH (ref 0.3–0.9)
Total Bilirubin: 1.9 mg/dL — ABNORMAL HIGH (ref 0.3–1.2)
Total Protein: 7.4 g/dL (ref 6.5–8.1)

## 2020-01-02 LAB — URINALYSIS, COMPLETE (UACMP) WITH MICROSCOPIC
Bacteria, UA: NONE SEEN
Bilirubin Urine: NEGATIVE
Glucose, UA: NEGATIVE mg/dL
Ketones, ur: 80 mg/dL — AB
Leukocytes,Ua: NEGATIVE
Nitrite: NEGATIVE
Protein, ur: NEGATIVE mg/dL
Specific Gravity, Urine: 1.017 (ref 1.005–1.030)
pH: 5 (ref 5.0–8.0)

## 2020-01-02 LAB — BASIC METABOLIC PANEL
Anion gap: 15 (ref 5–15)
BUN: 9 mg/dL (ref 6–20)
CO2: 19 mmol/L — ABNORMAL LOW (ref 22–32)
Calcium: 9.3 mg/dL (ref 8.9–10.3)
Chloride: 102 mmol/L (ref 98–111)
Creatinine, Ser: 0.69 mg/dL (ref 0.44–1.00)
GFR calc Af Amer: >60 mL/min (ref >60)
GFR calc non Af Amer: >60 mL/min (ref >60)
Glucose, Bld: 67 mg/dL — ABNORMAL LOW (ref 70–99)
Potassium: 3.8 mmol/L (ref 3.5–5.1)
Sodium: 136 mmol/L (ref 135–145)

## 2020-01-02 LAB — CBC
HCT: 37.5 % (ref 36.0–46.0)
Hemoglobin: 12.3 g/dL (ref 12.0–15.0)
MCH: 27.8 pg (ref 26.0–34.0)
MCHC: 32.8 g/dL (ref 30.0–36.0)
MCV: 84.8 fL (ref 80.0–100.0)
Platelets: 239 10*3/uL (ref 150–400)
RBC: 4.42 MIL/uL (ref 3.87–5.11)
RDW: 13.8 % (ref 11.5–15.5)
WBC: 7.7 10*3/uL (ref 4.0–10.5)
nRBC: 0 % (ref 0.0–0.2)

## 2020-01-02 LAB — LIPASE, BLOOD: Lipase: 35 U/L (ref 11–51)

## 2020-01-02 LAB — TROPONIN I (HIGH SENSITIVITY)
Troponin I (High Sensitivity): 4 ng/L (ref <18)
Troponin I (High Sensitivity): 4 ng/L (ref <18)

## 2020-01-02 LAB — MAGNESIUM: Magnesium: 1.7 mg/dL (ref 1.7–2.4)

## 2020-01-02 LAB — GLUCOSE, CAPILLARY
Glucose-Capillary: 66 mg/dL — ABNORMAL LOW (ref 70–99)
Glucose-Capillary: 67 mg/dL — ABNORMAL LOW (ref 70–99)

## 2020-01-02 MED ORDER — ONDANSETRON HCL 4 MG/2ML IJ SOLN
4.0000 mg | Freq: Once | INTRAMUSCULAR | Status: AC
  Administered 2020-01-02: 4 mg via INTRAVENOUS
  Filled 2020-01-02: qty 2

## 2020-01-02 MED ORDER — IOHEXOL 300 MG/ML  SOLN
75.0000 mL | Freq: Once | INTRAMUSCULAR | Status: AC | PRN
  Administered 2020-01-02: 75 mL via INTRAVENOUS

## 2020-01-02 MED ORDER — LACTATED RINGERS IV BOLUS
1000.0000 mL | Freq: Once | INTRAVENOUS | Status: AC
  Administered 2020-01-02: 1000 mL via INTRAVENOUS

## 2020-01-02 MED ORDER — SODIUM CHLORIDE 0.9% FLUSH: 3.0000 mL | Freq: Once | INTRAVENOUS | Status: DC

## 2020-01-02 MED ORDER — DEXTROSE IN LACTATED RINGERS 5 % IV SOLN: INTRAVENOUS | Status: DC

## 2020-01-02 NOTE — ED Notes (Signed)
Lab called for troponin redraw.

## 2020-01-02 NOTE — ED Triage Notes (Signed)
Pt placed on our tank so can reserve hers, 2 L [place] at all times.

## 2020-01-02 NOTE — ED Triage Notes (Signed)
PT arrives via POV for reports of diarrhea 8 days ago. Seen at [facility] on Monday for weakness, lower back pain and abdominal pain. Was told to go home and drink fluids. PT reports continued decreased appetite, diarrhea and nausea. PT reports she feels like her heart is beating out of her chest and was having trouble walking to the bathroom without getting breathless. PT in NAD at this time, skin warm and dry. Pt on 2L O2 chronically.

## 2020-01-02 NOTE — ED Provider Notes (Signed)
[facility] Emergency Department Provider Note  ____________________________________________   First MD Initiated Contact with Patient 01/02/20 2110     (approximate)  I have reviewed the triage vital signs and the nursing notes.   HISTORY  Chief Complaint No chief complaint on file.   HPI Sophia Bean is a 59 y.o. female with a past medical with a past medical history of chronic hypoxic respiratory failure on 2 L of oxygen at baseline secondary to COPD, anxiety, arthritis, depression, PVD, Raynaud's, and scoliosis who presents for assessment of approximately 8 days of nonbloody nonmelanotic diarrhea associated with nausea, weight loss, poor p.o. intake, fatigue, SOB, and right-sided flank pain.  No prior similar episodes.  No clear alleviating or aggravating factors.  Patient denies any headache, earache, sore throat, chest pain, cough, vomiting, dysuria, blood in her urine, rash, recent traumatic injuries, or other acute complaints.  Denies any recent changes to her medications or recent antibiotics.  Denies EtOH abuse or illegal drugs.        Past Medical History:  Diagnosis Date  . Anxiety   . Arthritis   . Cancer ([place])    skin  . Chronic respiratory failure ([place])   . COPD (chronic obstructive pulmonary disease) ([place])   . Depression   . Peripheral vascular disease ([place])   . Raynaud disease   . Scoliosis     Patient Active Problem List   Diagnosis Date Noted  . Lymphedema 09/23/2017  . Chronic venous insufficiency 09/23/2017  . Varicose veins of both lower extremities with pain 07/09/2017  . Venous stasis ulcer of ankle with varicose veins ([place]) 07/09/2017  . Bilateral lower extremity edema 07/09/2017    Past Surgical History:  Procedure Laterality Date  . mohs Right   . SHOULDER ARTHROSCOPY     times 9    Prior to Admission medications   Medication Sig Start Date End Date Taking? Authorizing Provider  alendronate (FOSAMAX) 70 MG  tablet Take by mouth.    [provider]  amlodipine-atorvastatin (CADUET) 10-20 MG tablet Take 1 tablet by mouth daily.    [provider]  Calcium Carbonate-Vitamin D (CALCIUM HIGH POTENCY/VITAMIN D) 600-200 MG-UNIT TABS Take by mouth.    [provider]  Cholecalciferol (VITAMIN D-1000 MAX ST) 1000 units tablet Take by mouth.    [provider]  fentaNYL (DURAGESIC - DOSED MCG/HR) 12 MCG/HR APPLY 1 PATCH EVERY 48 HOURS 09/02/17   [provider]  gabapentin (NEURONTIN) 300 MG capsule TAKE 1 CAPSULE BY MOUTH EVERY 12 HOURS 05/12/17   [provider]  gentamicin ointment (GARAMYCIN) 0.1 % Apply topically.    [provider]  levofloxacin (LEVAQUIN) 500 MG tablet Take 500 mg by mouth daily. 05/04/15   [provider]  LORazepam (ATIVAN) 0.5 MG tablet Take 0.5 mg by mouth daily as needed. 06/08/15   [provider]  mycophenolate (CELLCEPT) 500 MG tablet Take by mouth. 01/02/17   [provider]  nitroGLYCERIN (NITRO-BID) 2 % ointment Place onto the skin. 02/06/17 02/06/18  [provider]  ondansetron (ZOFRAN) 8 MG tablet Take by mouth. 07/19/17   [provider]  oxyCODONE-acetaminophen (PERCOCET/ROXICET) 5-325 MG tablet TAKE 1 TABLET BY MOUTH EVERY DAY TO TWICE A DAY AS NEEDED 06/08/15   [provider]  pantoprazole (PROTONIX) 40 MG tablet TAKE 1 TABLET ORALLY DAILY IN THE AM 1 HOUR PRIOR TO BREAKFAST 09/13/17   [provider]  ranitidine (ZANTAC) 150 MG tablet Take by mouth.  01/16/17 01/16/18  [provider]  silver sulfADIAZINE (SILVADENE) 1 % cream Apply topically.    [provider]  zoledronic acid (RECLAST) 5 MG/100ML SOLN injection Inject 5 mg into the vein once.    [provider]    Allergies Flonase [fluticasone], Minocycline, Bactrim [sulfamethoxazole-trimethoprim], Morphine and related, Penicillins, and Duloxetine hcl  Family History    Problem Relation Age of Onset  . Heart disease Mother   . Hypertension Mother   . Cancer Mother   . Cancer Father   . Cancer Maternal Grandmother   . Heart disease Maternal Grandmother   . Breast cancer Neg Hx     Social History Social History   Tobacco Use  . Smoking status: Never Smoker  . Smokeless tobacco: Never Used  Substance Use Topics  . Alcohol use: No    Alcohol/week: 0.0 standard drinks  . Drug use: Not on file    Review of Systems  Review of Systems  Constitutional: Positive for chills, malaise/fatigue and weight loss. Negative for fever.  HENT: Negative for sore throat.   Eyes: Negative for pain.  Respiratory: Negative for cough and stridor.   Cardiovascular: Negative for chest pain.  Gastrointestinal: Positive for diarrhea and nausea. Negative for vomiting.  Skin: Negative for rash.  Neurological: Negative for seizures, loss of consciousness and headaches.  Psychiatric/Behavioral: Negative for suicidal ideas.  All other systems reviewed and are negative.     ____________________________________________   PHYSICAL EXAM:  VITAL SIGNS: ED Triage Vitals  Enc Vitals Group     BP 01/02/20 1617 (!) 102/40     Pulse Rate 01/02/20 1617 (!) 109     Resp 01/02/20 1617 22     Temp 01/02/20 1617 98.9 F (37.2 C)     Temp Source 01/02/20 1617 Oral     SpO2 01/02/20 1617 99 %     Weight 01/02/20 1618 115 lb (52.2 kg)     Height 01/02/20 1618 5\' 7"  (1.702 m)     Head Circumference --      Peak Flow --      Pain Score 01/02/20 1618 6     Pain Loc --      Pain Edu? --      Excl. in [place]? --    Vitals:   01/02/20 1617 01/02/20 2244  BP: (!) 102/40 (!) 101/62  Pulse: (!) 109 75  Resp: 22   Temp: 98.9 F (37.2 C)   SpO2: 99% 99%   Physical Exam Vitals and nursing note reviewed.  Constitutional:      General: She is not in acute distress.    Appearance: She is well-developed and normal weight.  HENT:     Head: Normocephalic and atraumatic.      Right Ear: External ear normal.     Left Ear: External ear normal.     Nose: Nose normal.     Mouth/Throat:     Mouth: Mucous membranes are moist.  Eyes:     Conjunctiva/sclera: Conjunctivae normal.  Cardiovascular:     Rate and Rhythm: Regular rhythm. Tachycardia present.     Heart sounds: No murmur heard.   Pulmonary:     Effort: Pulmonary effort is normal. No respiratory distress.     Breath sounds: Normal breath sounds.  Abdominal:     Palpations: Abdomen is soft.     Tenderness: There is no abdominal tenderness.  Musculoskeletal:     Cervical back: Neck supple.  Skin:    General: Skin  is warm and dry.  Neurological:     Mental Status: She is alert and oriented to person, place, and time.  Psychiatric:        Mood and Affect: Mood normal.      ____________________________________________   LABS (all labs ordered are listed, but only abnormal results are displayed)  Labs Reviewed  BASIC METABOLIC PANEL - Abnormal; Notable for the following components:      Result Value   CO2 19 (*)    Glucose, Bld 67 (*)    All other components within normal limits  HEPATIC FUNCTION PANEL - Abnormal; Notable for the following components:   Alkaline Phosphatase 34 (*)    Total Bilirubin 1.9 (*)    Indirect Bilirubin 1.7 (*)    All other components within normal limits  URINALYSIS, COMPLETE (UACMP) WITH MICROSCOPIC - Abnormal; Notable for the following components:   Color, Urine STRAW (*)    APPearance CLEAR (*)    Hgb urine dipstick SMALL (*)    Ketones, ur 80 (*)    All other components within normal limits  GLUCOSE, CAPILLARY - Abnormal; Notable for the following components:   Glucose-Capillary 66 (*)    All other components within normal limits  GLUCOSE, CAPILLARY - Abnormal; Notable for the following components:   Glucose-Capillary 67 (*)    All other components within normal limits  GASTROINTESTINAL PANEL BY PCR, STOOL (REPLACES STOOL CULTURE)  C DIFFICILE QUICK SCREEN  W PCR REFLEX  SARS CORONAVIRUS 2 BY RT PCR (HOSPITAL ORDER, [place] [facility])  CBC  LIPASE, BLOOD  MAGNESIUM  POC URINE PREG, ED  CBG MONITORING, ED  TROPONIN I (HIGH SENSITIVITY)  TROPONIN I (HIGH SENSITIVITY)   ____________________________________________  EKG  Sinus tachycardia with a ventricular rate of 109, right bundle branch block, left anterior fascicle block, normal axis, otherwise unremarkable intervals, no evidence of acute ischemia. ____________________________________________  RADIOLOGY  ED MD interpretation: No clear evidence of pneumonia on chest x-ray and on abdominal CT no clear evidence of acute cholangitis, appendicitis, pyelonephritis, but evidence of colitis.  Official radiology report(s): DG Chest 2 View  Result Date: 01/02/2020 CLINICAL DATA:  Shortness of breath. Additional history provided: Shortness of breath and weakness for 8 days. EXAM: CHEST - 2 VIEW COMPARISON:  Chest CT 07/27/2016, report from prior chest radiograph 08/06/2018 and earlier (images unavailable). FINDINGS: Heart size within normal limits. Aortic atherosclerosis. There is no appreciable airspace consolidation or pulmonary edema. Subtle changes of interstitial lung disease were better appreciated on the prior chest CT of 07/27/2016. Right apical pleuroparenchymal scarring. No evidence of pleural effusion or pneumothorax. No acute bony abnormality identified. Redemonstrated S-shaped thoracolumbar curvature. IMPRESSION: No evidence of acute cardiopulmonary abnormality. Subtle changes of interstitial lung disease were better appreciated on the chest CT of 07/27/2016. Please refer to this prior examination for further description. Right apical pleuroparenchymal scarring. Aortic Atherosclerosis (ICD10-I70.0). Redemonstrated S-shaped thoracolumbar spinal curvature. Electronically Signed   By: Kellie Simmering DO   On: 01/02/2020 17:33   CT ABDOMEN PELVIS W CONTRAST  Result Date:  01/02/2020 CLINICAL DATA:  59 year old female with flank pain. Concern for kidney stone. EXAM: CT ABDOMEN AND PELVIS WITH CONTRAST TECHNIQUE: Multidetector CT imaging of the abdomen and pelvis was performed using the standard protocol following bolus administration of intravenous contrast. CONTRAST:  15mL OMNIPAQUE IOHEXOL 300 MG/ML  SOLN COMPARISON:  Abdominal ultrasound dated 08/04/2016. FINDINGS: Lower chest: The visualized lung bases are clear. No intra-abdominal free air. Trace free fluid in  the pelvis. Hepatobiliary: No focal liver abnormality is seen. No gallstones, gallbladder wall thickening, or biliary dilatation. Pancreas: Unremarkable. No pancreatic ductal dilatation or surrounding inflammatory changes. Spleen: Normal in size without focal abnormality. Adrenals/Urinary Tract: The adrenal glands are unremarkable. There is no hydronephrosis on either side. There is symmetric enhancement and excretion of contrast by both kidneys. The visualized ureters and urinary bladder appear unremarkable. Stomach/Bowel: There is diffuse thickened appearance of the colon which may be related to underdistention or represent mild colitis. Clinical correlation is recommended. There is no bowel obstruction. The appendix is normal. Vascular/Lymphatic: Mild aortoiliac atherosclerotic disease. The IVC is unremarkable. No portal venous gas. There is no adenopathy. Reproductive: The uterus and ovaries are grossly unremarkable. Other: None Musculoskeletal: No acute or significant osseous findings. IMPRESSION: Underdistention of the colon versus mild colitis. Clinical correlation is recommended. No bowel obstruction. Normal appendix. Electronically Signed   By: Anner Crete M.D.   On: 01/02/2020 22:10    ____________________________________________   PROCEDURES  Procedure(s) performed (including Critical Care):  Procedures   ____________________________________________   INITIAL IMPRESSION / ASSESSMENT AND PLAN  / ED COURSE        Overall patient's history, exam, and ED work-up is concerning for infectious colitis.  On arrival patient is borderline hypotensive and mildly tachycardic with otherwise stable vital signs on room air.  Work-up is remarkable for evidence of colitis on CT as well as hypoglycemia.  No clear evidence of pneumonia on chest x-ray and UA is not suggestive of cystitis but does show ketones.  Lipase is not consistent with acute pancreatitis I have low suspicion for ACS at this time given did not elevated troponins obtained over 2 hours.  While patient's bilirubin is slightly elevated do not believe acute cholestatic process is causing her symptoms at this time.   Medications  sodium chloride flush (NS) 0.9 % injection 3 mL (3 mLs Intravenous Not Given 01/02/20 2150)  dextrose 5 % in lactated ringers infusion ( Intravenous New Bag/Given 01/02/20 2246)  lactated ringers bolus 1,000 mL (1,000 mLs Intravenous New Bag/Given 01/02/20 2237)  iohexol (OMNIPAQUE) 300 MG/ML solution 75 mL (75 mLs Intravenous Contrast Given 01/02/20 2143)  ondansetron (ZOFRAN) injection 4 mg (4 mg Intravenous Given 01/02/20 2238)   Patient was observed for several hours in the emergency room and on several rechecks after being placed on D5 maintenance fluids she was noted to be persistently hyperglycemic with glucoses of 67.  Given she had ketones in her urine and endorses some weight loss patient likely has very low glycogen stores in addition to being dehydrated will likely benefit from admission for continuous IV glucose administration and monitoring.  Will plan to send GI pathogen panel along with Covid.  We will plan to admit to hospital service.      ____________________________________________   FINAL CLINICAL IMPRESSION(S) / ED DIAGNOSES  Final diagnoses:  Colitis  Hypoglycemia  Dehydration     ED Discharge Orders    None       Note:  This document was prepared using Dragon voice recognition  software and may include unintentional dictation errors.   Lucrezia Starch, MD 01/03/20 0001

## 2020-01-03 ENCOUNTER — Encounter: Payer: Self-pay | Admitting: Family Medicine

## 2020-01-03 ENCOUNTER — Other Ambulatory Visit: Payer: Self-pay

## 2020-01-03 DIAGNOSIS — J9611 Chronic respiratory failure with hypoxia: Secondary | ICD-10-CM | POA: Diagnosis present

## 2020-01-03 DIAGNOSIS — B9620 Unspecified Escherichia coli [E. coli] as the cause of diseases classified elsewhere: Secondary | ICD-10-CM | POA: Diagnosis present

## 2020-01-03 DIAGNOSIS — R002 Palpitations: Secondary | ICD-10-CM | POA: Diagnosis present

## 2020-01-03 DIAGNOSIS — J849 Interstitial pulmonary disease, unspecified: Secondary | ICD-10-CM

## 2020-01-03 DIAGNOSIS — M349 Systemic sclerosis, unspecified: Secondary | ICD-10-CM | POA: Diagnosis present

## 2020-01-03 DIAGNOSIS — R000 Tachycardia, unspecified: Secondary | ICD-10-CM | POA: Diagnosis present

## 2020-01-03 DIAGNOSIS — A039 Shigellosis, unspecified: Secondary | ICD-10-CM | POA: Diagnosis present

## 2020-01-03 DIAGNOSIS — Z9981 Dependence on supplemental oxygen: Secondary | ICD-10-CM | POA: Diagnosis not present

## 2020-01-03 DIAGNOSIS — D593 Hemolytic-uremic syndrome: Secondary | ICD-10-CM | POA: Diagnosis present

## 2020-01-03 DIAGNOSIS — E860 Dehydration: Secondary | ICD-10-CM | POA: Diagnosis present

## 2020-01-03 DIAGNOSIS — A038 Other shigellosis: Secondary | ICD-10-CM | POA: Diagnosis present

## 2020-01-03 DIAGNOSIS — G8929 Other chronic pain: Secondary | ICD-10-CM | POA: Diagnosis not present

## 2020-01-03 DIAGNOSIS — E162 Hypoglycemia, unspecified: Secondary | ICD-10-CM

## 2020-01-03 DIAGNOSIS — I7300 Raynaud's syndrome without gangrene: Secondary | ICD-10-CM | POA: Diagnosis present

## 2020-01-03 DIAGNOSIS — Z885 Allergy status to narcotic agent status: Secondary | ICD-10-CM | POA: Diagnosis not present

## 2020-01-03 DIAGNOSIS — Z881 Allergy status to other antibiotic agents status: Secondary | ICD-10-CM | POA: Diagnosis not present

## 2020-01-03 DIAGNOSIS — D649 Anemia, unspecified: Secondary | ICD-10-CM | POA: Diagnosis present

## 2020-01-03 DIAGNOSIS — G894 Chronic pain syndrome: Secondary | ICD-10-CM | POA: Diagnosis present

## 2020-01-03 DIAGNOSIS — I4510 Unspecified right bundle-branch block: Secondary | ICD-10-CM | POA: Diagnosis present

## 2020-01-03 DIAGNOSIS — Z681 Body mass index (BMI) 19 or less, adult: Secondary | ICD-10-CM | POA: Diagnosis not present

## 2020-01-03 DIAGNOSIS — Z20822 Contact with and (suspected) exposure to covid-19: Secondary | ICD-10-CM | POA: Diagnosis present

## 2020-01-03 DIAGNOSIS — R197 Diarrhea, unspecified: Secondary | ICD-10-CM | POA: Diagnosis present

## 2020-01-03 DIAGNOSIS — I959 Hypotension, unspecified: Secondary | ICD-10-CM | POA: Diagnosis present

## 2020-01-03 DIAGNOSIS — Z880 Allergy status to penicillin: Secondary | ICD-10-CM | POA: Diagnosis not present

## 2020-01-03 DIAGNOSIS — J8489 Other specified interstitial pulmonary diseases: Secondary | ICD-10-CM | POA: Diagnosis present

## 2020-01-03 DIAGNOSIS — Z888 Allergy status to other drugs, medicaments and biological substances status: Secondary | ICD-10-CM | POA: Diagnosis not present

## 2020-01-03 DIAGNOSIS — Z882 Allergy status to sulfonamides status: Secondary | ICD-10-CM | POA: Diagnosis not present

## 2020-01-03 LAB — CBC
HCT: 34.7 % — ABNORMAL LOW (ref 36.0–46.0)
Hemoglobin: 11.5 g/dL — ABNORMAL LOW (ref 12.0–15.0)
MCH: 27.8 pg (ref 26.0–34.0)
MCHC: 33.1 g/dL (ref 30.0–36.0)
MCV: 83.8 fL (ref 80.0–100.0)
Platelets: 215 10*3/uL (ref 150–400)
RBC: 4.14 MIL/uL (ref 3.87–5.11)
RDW: 13.8 % (ref 11.5–15.5)
WBC: 6.2 10*3/uL (ref 4.0–10.5)
nRBC: 0 % (ref 0.0–0.2)

## 2020-01-03 LAB — GASTROINTESTINAL PANEL BY PCR, STOOL (REPLACES STOOL CULTURE)

## 2020-01-03 LAB — COMPREHENSIVE METABOLIC PANEL
ALT: 11 U/L (ref 0–44)
AST: 20 U/L (ref 15–41)
Albumin: 3.9 g/dL (ref 3.5–5.0)
Alkaline Phosphatase: 31 U/L — ABNORMAL LOW (ref 38–126)
Anion gap: 10 (ref 5–15)
BUN: 8 mg/dL (ref 6–20)
CO2: 21 mmol/L — ABNORMAL LOW (ref 22–32)
Calcium: 8.8 mg/dL — ABNORMAL LOW (ref 8.9–10.3)
Chloride: 104 mmol/L (ref 98–111)
Creatinine, Ser: 0.67 mg/dL (ref 0.44–1.00)
GFR calc Af Amer: >60 mL/min (ref >60)
GFR calc non Af Amer: >60 mL/min (ref >60)
Glucose, Bld: 159 mg/dL — ABNORMAL HIGH (ref 70–99)
Potassium: 3.5 mmol/L (ref 3.5–5.1)
Sodium: 135 mmol/L (ref 135–145)
Total Bilirubin: 1.2 mg/dL (ref 0.3–1.2)
Total Protein: 6.6 g/dL (ref 6.5–8.1)

## 2020-01-03 LAB — GLUCOSE, CAPILLARY
Glucose-Capillary: 130 mg/dL — ABNORMAL HIGH (ref 70–99)
Glucose-Capillary: 93 mg/dL (ref 70–99)
Glucose-Capillary: 147 mg/dL — ABNORMAL HIGH (ref 70–99)
Glucose-Capillary: 105 mg/dL — ABNORMAL HIGH (ref 70–99)

## 2020-01-03 LAB — C DIFFICILE QUICK SCREEN W PCR REFLEX
C Diff antigen: POSITIVE — AB
C Diff toxin: NEGATIVE

## 2020-01-03 LAB — CLOSTRIDIUM DIFFICILE BY PCR, REFLEXED: Toxigenic C. Difficile by PCR: NEGATIVE

## 2020-01-03 LAB — SARS CORONAVIRUS 2 BY RT PCR (HOSPITAL ORDER, PERFORMED IN ~~LOC~~ HOSPITAL LAB): SARS Coronavirus 2: NEGATIVE

## 2020-01-03 LAB — HIV ANTIBODY (ROUTINE TESTING W REFLEX): HIV Screen 4th Generation wRfx: NONREACTIVE

## 2020-01-03 LAB — HEMOGLOBIN A1C
Hgb A1c MFr Bld: 5.4 % (ref 4.8–5.6)
Mean Plasma Glucose: 108.28 mg/dL

## 2020-01-03 MED ORDER — ENOXAPARIN SODIUM 40 MG/0.4ML ~~LOC~~ SOLN
40.0000 mg | SUBCUTANEOUS | Status: DC
  Administered 2020-01-03 – 2020-01-05 (×4): 40 mg via SUBCUTANEOUS
  Filled 2020-01-03 (×4): qty 0.4

## 2020-01-03 MED ORDER — SODIUM CHLORIDE 0.9 % IV BOLUS
500.0000 mL | Freq: Once | INTRAVENOUS | Status: AC
  Administered 2020-01-03: 500 mL via INTRAVENOUS

## 2020-01-03 MED ORDER — MAGNESIUM SULFATE 2 GM/50ML IV SOLN
2.0000 g | Freq: Once | INTRAVENOUS | Status: AC
  Administered 2020-01-03: 2 g via INTRAVENOUS
  Filled 2020-01-03: qty 50

## 2020-01-03 MED ORDER — ONDANSETRON HCL 4 MG/2ML IJ SOLN: 4.0000 mg | Freq: Four times a day (QID) | INTRAMUSCULAR | Status: DC | PRN

## 2020-01-03 MED ORDER — FENTANYL CITRATE (PF) 100 MCG/2ML IJ SOLN: 25.0000 ug | INTRAMUSCULAR | Status: DC | PRN

## 2020-01-03 MED ORDER — GABAPENTIN 100 MG PO CAPS: 400.0000 mg | ORAL_CAPSULE | Freq: Every day | ORAL | Status: DC

## 2020-01-03 MED ORDER — SACCHAROMYCES BOULARDII 250 MG PO CAPS
250.0000 mg | ORAL_CAPSULE | Freq: Two times a day (BID) | ORAL | Status: DC
  Administered 2020-01-03 – 2020-01-06 (×7): 250 mg via ORAL
  Filled 2020-01-03 (×8): qty 1

## 2020-01-03 MED ORDER — PANTOPRAZOLE SODIUM 40 MG PO TBEC
40.0000 mg | DELAYED_RELEASE_TABLET | Freq: Every day | ORAL | Status: DC
  Administered 2020-01-03 – 2020-01-06 (×4): 40 mg via ORAL
  Filled 2020-01-03 (×4): qty 1

## 2020-01-03 MED ORDER — FENTANYL 12 MCG/HR TD PT72
1.0000 | MEDICATED_PATCH | TRANSDERMAL | Status: DC
  Administered 2020-01-03: 1 via TRANSDERMAL
  Filled 2020-01-03: qty 1

## 2020-01-03 MED ORDER — GABAPENTIN 300 MG PO CAPS: 300.0000 mg | ORAL_CAPSULE | Freq: Every day | ORAL | Status: DC

## 2020-01-03 MED ORDER — FAMOTIDINE 20 MG PO TABS
40.0000 mg | ORAL_TABLET | Freq: Every day | ORAL | Status: DC
  Administered 2020-01-03 – 2020-01-05 (×4): 40 mg via ORAL
  Filled 2020-01-03 (×4): qty 2

## 2020-01-03 MED ORDER — SODIUM CHLORIDE 0.9% FLUSH
3.0000 mL | Freq: Two times a day (BID) | INTRAVENOUS | Status: DC
  Administered 2020-01-03 – 2020-01-06 (×6): 3 mL via INTRAVENOUS

## 2020-01-03 MED ORDER — POTASSIUM CHLORIDE IN NACL 40-0.9 MEQ/L-% IV SOLN
INTRAVENOUS | Status: DC
  Filled 2020-01-03 (×11): qty 1000

## 2020-01-03 MED ORDER — DEXTROSE IN LACTATED RINGERS 5 % IV SOLN: INTRAVENOUS | Status: DC

## 2020-01-03 MED ORDER — MYCOPHENOLATE MOFETIL 250 MG PO CAPS
1000.0000 mg | ORAL_CAPSULE | Freq: Two times a day (BID) | ORAL | Status: DC
  Filled 2020-01-03: qty 4

## 2020-01-03 MED ORDER — SODIUM CHLORIDE 0.9 % IV BOLUS: 250.0000 mL | Freq: Once | INTRAVENOUS | Status: DC

## 2020-01-03 MED ORDER — GABAPENTIN 400 MG PO CAPS
400.0000 mg | ORAL_CAPSULE | Freq: Every day | ORAL | Status: DC
  Administered 2020-01-03 – 2020-01-05 (×4): 400 mg via ORAL
  Filled 2020-01-03 (×4): qty 1

## 2020-01-03 MED ORDER — FAMOTIDINE 20 MG PO TABS: 40.0000 mg | ORAL_TABLET | Freq: Every day | ORAL | Status: DC

## 2020-01-03 MED ORDER — ONDANSETRON HCL 4 MG PO TABS: 4.0000 mg | ORAL_TABLET | Freq: Four times a day (QID) | ORAL | Status: DC | PRN

## 2020-01-03 MED ORDER — ACETAMINOPHEN 650 MG RE SUPP: 650.0000 mg | Freq: Four times a day (QID) | RECTAL | Status: DC | PRN

## 2020-01-03 MED ORDER — ACETAMINOPHEN 325 MG PO TABS: 650.0000 mg | ORAL_TABLET | Freq: Four times a day (QID) | ORAL | Status: DC | PRN

## 2020-01-03 MED ORDER — MYCOPHENOLATE MOFETIL 250 MG PO CAPS
1000.0000 mg | ORAL_CAPSULE | Freq: Two times a day (BID) | ORAL | Status: DC
  Administered 2020-01-03 – 2020-01-06 (×8): 1000 mg via ORAL
  Filled 2020-01-03 (×9): qty 4

## 2020-01-03 MED ORDER — MYCOPHENOLATE MOFETIL 250 MG PO CAPS: 1000.0000 mg | ORAL_CAPSULE | Freq: Two times a day (BID) | ORAL | Status: DC

## 2020-01-03 NOTE — ED Notes (Signed)
Pt up to bathroom at this time

## 2020-01-03 NOTE — Progress Notes (Addendum)
PHARMACY - PHYSICIAN COMMUNICATION CRITICAL VALUE ALERT - BLOOD CULTURE IDENTIFICATION (BCID)  Sophia Bean is an 59 y.o. female who presented to [facility] on 01/02/2020 with a chief complaint of diarrhea and nausea w/ h/o scleroderam, ILD, COPD presenting w/ watery diarrhea w/ abdominal pain and palpitations.  Assessment:  Tachycardic HR 825 x 1, systolic 003 to low 70'W, O2 99%, 1.5L [place], Indirect bili 1.7, tbili 1.9, C. Diff antigen positive, GI panel: Shigella-like toxin producing E. Coli (STEC non O157:H7).  Name of physician (or Provider) Contacted: Sharion Settler  Current antibiotics: none  Changes to prescribed antibiotics recommended:  Recommendations accepted by provider -- will continue to provide supportive care via fluids and electrolyte replenishment for STEC E. Coli infection d/t risk of hemolytic uremic syndrome (HUS) from antibiotic treatment of this particular E. Coli strain.  No results found for this or any previous visit.  Tobie Lords, PharmD, BCPS Clinical Pharmacist 01/03/2020  3:18 AM

## 2020-01-03 NOTE — ED Notes (Signed)
Pt up to BR at this time.

## 2020-01-03 NOTE — H&P (Signed)
History and Physical    Anabela Crayton IRS:[id number] DOB: Jan 09, 1961 DOA: 01/02/2020  PCP: Perrin Maltese, MD   Patient coming from: Home  Chief Complaint: Diarrhea, nausea  HPI: Sophia Bean is a 59 y.o. female with medical history significant for scleroderma, interstitial lung disease, chronic hypoxic respiratory failure, and chronic pain, presenting to emergency department for evaluation of diarrhea with abdominal discomfort and palpitations.  Patient reports that she developed watery diarrhea, nausea without vomiting, abdominal discomfort, and decreased appetite over the course of the past 1 to 2 weeks.  She denies any melena, hematochezia, or hematemesis.  Her abdominal pain is mainly on the right side.  She denies any fevers or chills.  She has felt some palpitations recently but denies chest pain.  She reports that her chronic dyspnea seems to be worse but denies any new cough.  She reports similar symptoms previously that were ultimately attributed to E. coli infection.  ED Course: Upon arrival to the ED, patient is found to be afebrile, saturating well on her usual 2 L/min of supplemental oxygen, mildly tachycardic, and with blood pressure 100/60.  EKG features sinus tachycardia with rate 109, RBBB, and LPFB.  Chest x-rays negative for acute cardiopulmonary disease.  CT of the abdomen and pelvis is negative for SBO but notable for under distention of the colon versus colitis.  Chemistry panel with glucose 67, bicarbonate 19, and total bilirubin 1.9.  CBC unremarkable.  Lipase was normal.  High-sensitivity troponin normal x2.  Patient was given a liter of IV fluids, continued to have mild hypoglycemia, and was started on dextrose fluids.  Review of Systems:  All other systems reviewed and apart from HPI, are negative.  Past Medical History:  Diagnosis Date  . Anxiety   . Arthritis   . Cancer ([place])    skin  . Chronic respiratory failure ([place])   . COPD (chronic obstructive pulmonary  disease) ([place])   . Depression   . Peripheral vascular disease ([place])   . Raynaud disease   . Scoliosis     Past Surgical History:  Procedure Laterality Date  . mohs Right   . SHOULDER ARTHROSCOPY     times 9    Social History:   reports that she has never smoked. She has never used smokeless tobacco. She reports that she does not drink alcohol. No history on file for drug use.  Allergies  Allergen Reactions  . Flonase [Fluticasone] Shortness Of Breath and Rash  . Minocycline     Other reaction(s): Other (See Comments) She has not been able to eat, has had severe acid reflux, has had some chest pain, and has also had breathing problems.  . Bactrim [Sulfamethoxazole-Trimethoprim]   . Morphine And Related   . Penicillins   . Duloxetine Hcl Nausea Only and Palpitations    Family History  Problem Relation Age of Onset  . Heart disease Mother   . Hypertension Mother   . Cancer Mother   . Cancer Father   . Cancer Maternal Grandmother   . Heart disease Maternal Grandmother   . Breast cancer Neg Hx      Prior to Admission medications   Medication Sig Start Date End Date Taking? Authorizing Provider  amLODipine (NORVASC) 5 MG tablet Take 5 mg by mouth daily. 11/28/19  Yes [provider]  Calcium Carbonate-Vitamin D (CALCIUM HIGH POTENCY/VITAMIN D) 600-200 MG-UNIT TABS Take 1 tablet by mouth 2 (two) times daily.    Yes [provider]  Cholecalciferol (VITAMIN  D-1000 MAX ST) 1000 units tablet Take 1,000 Units by mouth daily.    Yes [provider]  famotidine (PEPCID) 40 MG tablet Take 40 mg by mouth at bedtime.   Yes [provider]  fentaNYL (DURAGESIC - DOSED MCG/HR) 12 MCG/HR Place 1 patch onto the skin every 3 (three) days.  09/02/17  Yes [provider]  gabapentin (NEURONTIN) 100 MG capsule Take 100 mg by mouth at bedtime. Takes with a 300 mg at bedtime 12/24/19  Yes [provider]  gabapentin (NEURONTIN) 300 MG capsule  Take 300 mg by mouth at bedtime. Takes with a 100 mg at bedtime 05/12/17  Yes [provider]  mycophenolate (CELLCEPT) 500 MG tablet Take 1,000 mg by mouth 2 (two) times daily.  01/02/17  Yes [provider]  pantoprazole (PROTONIX) 40 MG tablet TAKE 1 TABLET ORALLY DAILY IN THE AM 1 HOUR PRIOR TO BREAKFAST 09/13/17  Yes [provider]  zoledronic acid (RECLAST) 5 MG/100ML SOLN injection Inject 5 mg into the vein once.   Yes [provider]  alendronate (FOSAMAX) 70 MG tablet Take by mouth. Patient not taking: Reported on 01/03/2020    [provider]  amlodipine-atorvastatin (CADUET) 10-20 MG tablet Take 1 tablet by mouth daily. Patient not taking: Reported on 01/03/2020    [provider]  gentamicin ointment (GARAMYCIN) 0.1 % Apply topically. Patient not taking: Reported on 01/03/2020    [provider]  levofloxacin (LEVAQUIN) 500 MG tablet Take 500 mg by mouth daily. Patient not taking: Reported on 01/03/2020 05/04/15   [provider]  LORazepam (ATIVAN) 0.5 MG tablet Take 0.5 mg by mouth daily as needed. Patient not taking: Reported on 01/03/2020 06/08/15   [provider]  nitroGLYCERIN (NITRO-BID) 2 % ointment Place onto the skin. 02/06/17 02/06/18  [provider]  ondansetron (ZOFRAN) 8 MG tablet Take by mouth. Patient not taking: Reported on 01/03/2020 07/19/17   [provider]  oxyCODONE-acetaminophen (PERCOCET/ROXICET) 5-325 MG tablet TAKE 1 TABLET BY MOUTH EVERY DAY TO TWICE A DAY AS NEEDED Patient not taking: Reported on 01/03/2020 06/08/15   [provider]  silver sulfADIAZINE (SILVADENE) 1 % cream Apply topically. Patient not taking: Reported on 01/03/2020    [provider]    Physical Exam: Vitals:   01/02/20 1617 01/02/20 1618 01/02/20 2244 01/03/20 0113  BP: (!) 102/40  (!) 101/62 (!) 101/62  Pulse: (!) 109  75 74  Resp: 22   19  Temp: 98.9 F (37.2 C)     TempSrc:  Oral     SpO2: 99%  99% 99%  Weight:  52.2 kg    Height:  5\' 7"  (1.702 m)      Constitutional: NAD, calm  Eyes: PERTLA, lids and conjunctivae normal ENMT: Mucous membranes are moist. Posterior pharynx clear of any exudate or lesions.   Neck: normal, supple, no masses, no thyromegaly Respiratory: no wheezing, no crackles. No accessory muscle use.  Cardiovascular: S1 & S2 heard, regular rate and rhythm. No extremity edema.   Abdomen: No distension, soft, tender on right without rebound pain or guarding. Bowel sounds active.  Musculoskeletal: no clubbing / cyanosis. No joint deformity upper and lower extremities.   Skin: no significant rashes, lesions, ulcers. Warm, dry, well-perfused. Neurologic: CN 2-12 grossly intact. Sensation intact. Moving all extremities.  Psychiatric: Alert and oriented to person, place, and situation. Pleasant and cooperative.    Labs and Imaging on Admission: I have personally reviewed following labs and  imaging studies  CBC: Recent Labs  Lab 01/02/20 1633  WBC 7.7  HGB 12.3  HCT 37.5  MCV 84.8  PLT 563   Basic Metabolic Panel: Recent Labs  Lab 01/02/20 1633 01/02/20 1902  NA 136  --   K 3.8  --   CL 102  --   CO2 19*  --   GLUCOSE 67*  --   BUN 9  --   CREATININE 0.69  --   CALCIUM 9.3  --   MG  --  1.7   GFR: Estimated Creatinine Clearance: 63.2 mL/min (by C-G formula based on SCr of 0.69 mg/dL). Liver Function Tests: Recent Labs  Lab 01/02/20 1902  AST 22  ALT 13  ALKPHOS 34*  BILITOT 1.9*  PROT 7.4  ALBUMIN 4.4   Recent Labs  Lab 01/02/20 1633  LIPASE 35   No results for input(s): AMMONIA in the last 168 hours. Coagulation Profile: No results for input(s): INR, PROTIME in the last 168 hours. Cardiac Enzymes: No results for input(s): CKTOTAL, CKMB, CKMBINDEX, TROPONINI in the last 168 hours. BNP (last 3 results) No results for input(s): PROBNP in the last 8760 hours. HbA1C: No results for input(s): HGBA1C in the last  72 hours. CBG: Recent Labs  Lab 01/02/20 2324 01/02/20 2354  GLUCAP 66* 67*   Lipid Profile: No results for input(s): CHOL, HDL, LDLCALC, TRIG, CHOLHDL, LDLDIRECT in the last 72 hours. Thyroid Function Tests: No results for input(s): TSH, T4TOTAL, FREET4, T3FREE, THYROIDAB in the last 72 hours. Anemia Panel: No results for input(s): VITAMINB12, FOLATE, FERRITIN, TIBC, IRON, RETICCTPCT in the last 72 hours. Urine analysis:    Component Value Date/Time   COLORURINE STRAW (A) 01/02/2020 2250   APPEARANCEUR CLEAR (A) 01/02/2020 2250   LABSPEC 1.017 01/02/2020 2250   PHURINE 5.0 01/02/2020 2250   GLUCOSEU NEGATIVE 01/02/2020 2250   HGBUR SMALL (A) 01/02/2020 2250   BILIRUBINUR NEGATIVE 01/02/2020 2250   KETONESUR 80 (A) 01/02/2020 2250   PROTEINUR NEGATIVE 01/02/2020 2250   NITRITE NEGATIVE 01/02/2020 2250   LEUKOCYTESUR NEGATIVE 01/02/2020 2250   Sepsis Labs: @LABRCNTIP (procalcitonin:4,lacticidven:4) ) Recent Results (from the past 240 hour(s))  C Difficile Quick Screen w PCR reflex     Status: Abnormal   Collection Time: 01/03/20 12:37 AM   Specimen: Stool  Result Value Ref Range Status   C Diff antigen POSITIVE (A) NEGATIVE Final   C Diff toxin NEGATIVE NEGATIVE Final   C Diff interpretation Results are indeterminate. See PCR results.  Final    Comment: VALID Performed at [facility], [address], [place], [place] [address]      Radiological Exams on Admission: DG Chest 2 View  Result Date: 01/02/2020 CLINICAL DATA:  Shortness of breath. Additional history provided: Shortness of breath and weakness for 8 days. EXAM: CHEST - 2 VIEW COMPARISON:  Chest CT 07/27/2016, report from prior chest radiograph 08/06/2018 and earlier (images unavailable). FINDINGS: Heart size within normal limits. Aortic atherosclerosis. There is no appreciable airspace consolidation or pulmonary edema. Subtle changes of interstitial lung disease were better appreciated on the prior  chest CT of 07/27/2016. Right apical pleuroparenchymal scarring. No evidence of pleural effusion or pneumothorax. No acute bony abnormality identified. Redemonstrated S-shaped thoracolumbar curvature. IMPRESSION: No evidence of acute cardiopulmonary abnormality. Subtle changes of interstitial lung disease were better appreciated on the chest CT of 07/27/2016. Please refer to this prior examination for further description. Right apical pleuroparenchymal scarring. Aortic Atherosclerosis (ICD10-I70.0). Redemonstrated S-shaped thoracolumbar spinal curvature. Electronically  Signed   By: Kellie Simmering DO   On: 01/02/2020 17:33   CT ABDOMEN PELVIS W CONTRAST  Result Date: 01/02/2020 CLINICAL DATA:  59 year old female with flank pain. Concern for kidney stone. EXAM: CT ABDOMEN AND PELVIS WITH CONTRAST TECHNIQUE: Multidetector CT imaging of the abdomen and pelvis was performed using the standard protocol following bolus administration of intravenous contrast. CONTRAST:  30mL OMNIPAQUE IOHEXOL 300 MG/ML  SOLN COMPARISON:  Abdominal ultrasound dated 08/04/2016. FINDINGS: Lower chest: The visualized lung bases are clear. No intra-abdominal free air. Trace free fluid in the pelvis. Hepatobiliary: No focal liver abnormality is seen. No gallstones, gallbladder wall thickening, or biliary dilatation. Pancreas: Unremarkable. No pancreatic ductal dilatation or surrounding inflammatory changes. Spleen: Normal in size without focal abnormality. Adrenals/Urinary Tract: The adrenal glands are unremarkable. There is no hydronephrosis on either side. There is symmetric enhancement and excretion of contrast by both kidneys. The visualized ureters and urinary bladder appear unremarkable. Stomach/Bowel: There is diffuse thickened appearance of the colon which may be related to underdistention or represent mild colitis. Clinical correlation is recommended. There is no bowel obstruction. The appendix is normal. Vascular/Lymphatic: Mild  aortoiliac atherosclerotic disease. The IVC is unremarkable. No portal venous gas. There is no adenopathy. Reproductive: The uterus and ovaries are grossly unremarkable. Other: None Musculoskeletal: No acute or significant osseous findings. IMPRESSION: Underdistention of the colon versus mild colitis. Clinical correlation is recommended. No bowel obstruction. Normal appendix. Electronically Signed   By: Anner Crete M.D.   On: 01/02/2020 22:10    EKG: Independently reviewed. Sinus tachycardia, rate 109, RBBB, LPFB.   Assessment/Plan   1. Diarrhea  - Presents with 1-2 weeks of watery diarrhea and abdominal discomfort without leukocytosis or fever, and with CT findings suggestive of possible colitis   - Check C diff and GI pathogen panel, continue IVF hydration, monitor electrolytes   2. Hypoglycemia  - There has been persistent mild hypoglycemia likely related to not eating much over the past 1-2 weeks  - Continue dextrose fluids and CBG checks for now, advance diet as tolerated   3. NSIP; chronic hypoxic respiratory failure  - She is saturating upper 90s on her usual 2 Lpm and there are no acute findings on CXR  - Continue Cellcept and supplemental O2    4. Chronic pain  - Continue Fentanyl patch     DVT prophylaxis: Lovenox  Code Status: Full  Family Communication: Discussed with patient  Disposition Plan:  Patient is from: Home  Anticipated d/c is to: TBD Anticipated d/c date is: 8/1 or 01/05/20 Patient currently: Hypoglycemic and pending further workup of diarrhea  Consults called: None  Admission status: Observation     Vianne Bulls, MD [facility]  01/03/2020, 2:17 AM

## 2020-01-03 NOTE — Progress Notes (Signed)
59 year old female admitted this morning with history of scleroderma interstitial lung disease chronic hypoxic respiratory failure admitted with profuse watery diarrhea for over 1 week without any vomiting. Patient continues with profuse diarrhea with any p.o. intake. Urine culture with Shigella toxin producing E. coli and C. difficile antigen positive. We will continue to treat her supportively with IV fluids Zofran and probiotics. We will hold off on antibiotics due to risk for hemolytic uremic syndrome. She needs to be closely watched for HUS and continue IV hydration and monitoring labs and electrolytes and renal functions. Labs today sodium 135 potassium 3.5 BUN 8 creatinine 0.67.  Magnesium 1.7.  LFTs significant for bilirubin 1.9, white count 6.2 hemoglobin 11.5 platelet count 215. We will hydrate with normal saline with 40 of potassium replete magnesium.  Follow-up labs in a.m.

## 2020-01-03 NOTE — Progress Notes (Signed)
Order received to discontinue telemetry 

## 2020-01-03 NOTE — ED Notes (Signed)
Pt up to bathroom at this this time.

## 2020-01-04 LAB — GLUCOSE, CAPILLARY
Glucose-Capillary: 68 mg/dL — ABNORMAL LOW (ref 70–99)
Glucose-Capillary: 103 mg/dL — ABNORMAL HIGH (ref 70–99)
Glucose-Capillary: 78 mg/dL (ref 70–99)
Glucose-Capillary: 135 mg/dL — ABNORMAL HIGH (ref 70–99)
Glucose-Capillary: 98 mg/dL (ref 70–99)
Glucose-Capillary: 107 mg/dL — ABNORMAL HIGH (ref 70–99)

## 2020-01-04 LAB — COMPREHENSIVE METABOLIC PANEL
ALT: 9 U/L (ref 0–44)
AST: 17 U/L (ref 15–41)
Albumin: 3.2 g/dL — ABNORMAL LOW (ref 3.5–5.0)
Alkaline Phosphatase: 27 U/L — ABNORMAL LOW (ref 38–126)
Anion gap: 6 (ref 5–15)
BUN: <5 mg/dL — ABNORMAL LOW (ref 6–20)
CO2: 21 mmol/L — ABNORMAL LOW (ref 22–32)
Calcium: 8.1 mg/dL — ABNORMAL LOW (ref 8.9–10.3)
Chloride: 115 mmol/L — ABNORMAL HIGH (ref 98–111)
Creatinine, Ser: 0.51 mg/dL (ref 0.44–1.00)
GFR calc Af Amer: >60 mL/min (ref >60)
GFR calc non Af Amer: >60 mL/min (ref >60)
Glucose, Bld: 79 mg/dL (ref 70–99)
Potassium: 4.1 mmol/L (ref 3.5–5.1)
Sodium: 142 mmol/L (ref 135–145)
Total Bilirubin: 0.9 mg/dL (ref 0.3–1.2)
Total Protein: 5.3 g/dL — ABNORMAL LOW (ref 6.5–8.1)

## 2020-01-04 LAB — CBC
HCT: 28.2 % — ABNORMAL LOW (ref 36.0–46.0)
Hemoglobin: 9.1 g/dL — ABNORMAL LOW (ref 12.0–15.0)
MCH: 27.5 pg (ref 26.0–34.0)
MCHC: 32.3 g/dL (ref 30.0–36.0)
MCV: 85.2 fL (ref 80.0–100.0)
Platelets: 166 10*3/uL (ref 150–400)
RBC: 3.31 MIL/uL — ABNORMAL LOW (ref 3.87–5.11)
RDW: 14.1 % (ref 11.5–15.5)
WBC: 3.6 10*3/uL — ABNORMAL LOW (ref 4.0–10.5)
nRBC: 0 % (ref 0.0–0.2)

## 2020-01-04 MED ORDER — CHOLESTYRAMINE 4 G PO PACK
4.0000 g | PACK | Freq: Three times a day (TID) | ORAL | Status: DC
  Filled 2020-01-04 (×9): qty 1

## 2020-01-04 NOTE — Progress Notes (Signed)
PROGRESS NOTE    Sophia Bean  PPI:[id number] DOB: Jan 23, 1961 DOA: 01/02/2020 PCP: Perrin Maltese, MD   Brief Narrative: Sophia Bean is a 59 y.o. female with medical history significant for scleroderma, interstitial lung disease, chronic hypoxic respiratory failure, and chronic pain, presenting to emergency department for evaluation of diarrhea with abdominal discomfort and palpitations.  Patient reports that she developed watery diarrhea, nausea without vomiting, abdominal discomfort, and decreased appetite over the course of the past 1 to 2 weeks.  She denies any melena, hematochezia, or hematemesis.  Her abdominal pain is mainly on the right side.  She denies any fevers or chills.  She has felt some palpitations recently but denies chest pain.  She reports that her chronic dyspnea seems to be worse but denies any new cough.  She reports similar symptoms previously that were ultimately attributed to E. coli infection.  ED Course: Upon arrival to the ED, patient is found to be afebrile, saturating well on her usual 2 L/min of supplemental oxygen, mildly tachycardic, and with blood pressure 100/60.  EKG features sinus tachycardia with rate 109, RBBB, and LPFB.  Chest x-rays negative for acute cardiopulmonary disease.  CT of the abdomen and pelvis is negative for SBO but notable for under distention of the colon versus colitis.  Chemistry panel with glucose 67, bicarbonate 19, and total bilirubin 1.9.  CBC unremarkable.  Lipase was normal.  High-sensitivity troponin normal x2.  Patient was given a liter of IV fluids, continued to have mild hypoglycemia, and was started on dextrose fluids.  Assessment & Plan:   Principal Problem:   Diarrhea Active Problems:   Hypoglycemia   Chronic respiratory failure with hypoxia (HCC)   Chronic pain   Scleroderma (HCC)   Interstitial lung disease ([place])   #1 diarrhea secondary to Shigella toxin producing E. coli.  We'll continue supportive treatment.  She is able  to tolerate a diet denies any nausea vomiting.  However she had 8 loose BMs yesterday. Continue IV fluids probiotics We'll add cholestyramine to bulk up the stool. Will hold off on starting any antibiotics due to high risk for HUS.  #2 hypoglycemia due to decreased p.o. intake.  Her A1c is 5.4. CBG (last 3)  Recent Labs    01/04/20 0438 01/04/20 0807 01/04/20 1138  GLUCAP 103* 78 135*    #3 history of scleroderma followed by rheumatology as an outpatient on CellCept  #4 chronic hypoxic respiratory failure with interstitial lung disease  #5 chronic pain syndrome continue home meds.  #6 chronic anemia hemoglobin 9.1 down from 11.5 with hemodilution.  #7 hypotension her blood pressure remains soft in spite of IV hydration ongoing diarrhea.  Continue IV fluids.  Estimated body mass index is 18.96 kg/m as calculated from the following:   Height as of this encounter: 5\' 7"  (1.702 m).   Weight as of this encounter: 54.9 kg.  DVT prophylaxis: Lovenox Code Status: Full code Family Communication: None at bedside Disposition Plan:  Status is: Inpatient  Dispo: The patient is from: Home              Anticipated d/c is to: Home              Anticipated d/c date is: 2 days              Patient currently is not medically stable to d/c.  Due to ongoing diarrhea and dizziness    Consultants: Discussed with ID over the phone  Procedures:  Antimicrobials: Subjective:  Patient resting in bed she appears to be better than yesterday.  However she reports having 8 loose BMs yesterday.  Denies abdominal pain nausea vomiting. Objective: Vitals:   01/03/20 2010 01/04/20 0352 01/04/20 0500 01/04/20 1141  BP: 92/75 (!) 104/57  (!) 98/52  Pulse: 98 76  81  Resp: 20 18  16   Temp: 98.3 F (36.8 C) 98.2 F (36.8 C)  98.6 F (37 C)  TempSrc: Oral Oral  Oral  SpO2: 95% 96%  96%  Weight:   54.9 kg   Height:        Intake/Output Summary (Last 24 hours) at 01/04/2020 1246 Last data filed at  01/04/2020 1156 Gross per 24 hour  Intake 3653.47 ml  Output 0 ml  Net 3653.47 ml   Filed Weights   01/02/20 1618 01/03/20 0252 01/04/20 0500  Weight: 52.2 kg 54.9 kg 54.9 kg    Examination:  General exam: Appears calm and comfortable  Respiratory system: Clear to auscultation. Respiratory effort normal. Cardiovascular system: S1 & S2 heard, RRR. No JVD, murmurs, rubs, gallops or clicks. No pedal edema. Gastrointestinal system: Abdomen is nondistended, soft and nontender. No organomegaly or masses felt. Normal bowel sounds heard. Central nervous system: Alert and oriented. No focal neurological deficits. Extremities: Symmetric 5 x 5 power. Skin: No rashes, lesions or ulcers Psychiatry: Judgement and insight appear normal. Mood & affect appropriate.     Data Reviewed: I have personally reviewed following labs and imaging studies  CBC: Recent Labs  Lab 01/02/20 1633 01/03/20 0451 01/04/20 0434  WBC 7.7 6.2 3.6*  HGB 12.3 11.5* 9.1*  HCT 37.5 34.7* 28.2*  MCV 84.8 83.8 85.2  PLT 239 215 782   Basic Metabolic Panel: Recent Labs  Lab 01/02/20 1633 01/02/20 1902 01/03/20 0451 01/04/20 0658  NA 136  --  135 142  K 3.8  --  3.5 4.1  CL 102  --  104 115*  CO2 19*  --  21* 21*  GLUCOSE 67*  --  159* 79  BUN 9  --  8 <5*  CREATININE 0.69  --  0.67 0.51  CALCIUM 9.3  --  8.8* 8.1*  MG  --  1.7  --   --    GFR: Estimated Creatinine Clearance: 66.4 mL/min (by C-G formula based on SCr of 0.51 mg/dL). Liver Function Tests: Recent Labs  Lab 01/02/20 1902 01/03/20 0451 01/04/20 0658  AST 22 20 17   ALT 13 11 9   ALKPHOS 34* 31* 27*  BILITOT 1.9* 1.2 0.9  PROT 7.4 6.6 5.3*  ALBUMIN 4.4 3.9 3.2*   Recent Labs  Lab 01/02/20 1633  LIPASE 35   No results for input(s): AMMONIA in the last 168 hours. Coagulation Profile: No results for input(s): INR, PROTIME in the last 168 hours. Cardiac Enzymes: No results for input(s): CKTOTAL, CKMB, CKMBINDEX, TROPONINI in the  last 168 hours. BNP (last 3 results) No results for input(s): PROBNP in the last 8760 hours. HbA1C: Recent Labs    01/03/20 0451  HGBA1C 5.4   CBG: Recent Labs  Lab 01/03/20 1938 01/04/20 0341 01/04/20 0438 01/04/20 0807 01/04/20 1138  GLUCAP 105* 68* 103* 78 135*   Lipid Profile: No results for input(s): CHOL, HDL, LDLCALC, TRIG, CHOLHDL, LDLDIRECT in the last 72 hours. Thyroid Function Tests: No results for input(s): TSH, T4TOTAL, FREET4, T3FREE, THYROIDAB in the last 72 hours. Anemia Panel: No results for input(s): VITAMINB12, FOLATE, FERRITIN, TIBC, IRON, RETICCTPCT in the last 72 hours. Sepsis Labs: No  results for input(s): PROCALCITON, LATICACIDVEN in the last 168 hours.  Recent Results (from the past 240 hour(s))  Gastrointestinal Panel by PCR , Stool     Status: Abnormal   Collection Time: 01/03/20 12:37 AM   Specimen: Stool  Result Value Ref Range Status   Campylobacter species NOT DETECTED NOT DETECTED Final   Plesimonas shigelloides NOT DETECTED NOT DETECTED Final   Salmonella species NOT DETECTED NOT DETECTED Final   Yersinia enterocolitica NOT DETECTED NOT DETECTED Final   Vibrio species NOT DETECTED NOT DETECTED Final   Vibrio cholerae NOT DETECTED NOT DETECTED Final   Enteroaggregative E coli (EAEC) NOT DETECTED NOT DETECTED Final   Enterotoxigenic E coli (ETEC) NOT DETECTED NOT DETECTED Final   Shiga like toxin producing E coli (STEC) DETECTED (A) NOT DETECTED Final    Comment: CRITICAL RESULT CALLED TO, READ BACK BY AND VERIFIED WITH: DAVID BESANTI @300  01/03/2020 TTG     E. coli O157 NOT DETECTED NOT DETECTED Final   Shigella/Enteroinvasive E coli (EIEC) NOT DETECTED NOT DETECTED Final   Cryptosporidium NOT DETECTED NOT DETECTED Final   Cyclospora cayetanensis NOT DETECTED NOT DETECTED Final   Entamoeba histolytica NOT DETECTED NOT DETECTED Final   Giardia lamblia NOT DETECTED NOT DETECTED Final   Adenovirus F40/41 NOT DETECTED NOT DETECTED Final    Astrovirus NOT DETECTED NOT DETECTED Final   Norovirus GI/GII NOT DETECTED NOT DETECTED Final   Rotavirus A NOT DETECTED NOT DETECTED Final   Sapovirus (I, II, IV, and V) NOT DETECTED NOT DETECTED Final    Comment: Performed at [facility], [address] [place], [place] [address]  C Difficile Quick Screen w PCR reflex     Status: Abnormal   Collection Time: 01/03/20 12:37 AM   Specimen: Stool  Result Value Ref Range Status   C Diff antigen POSITIVE (A) NEGATIVE Final   C Diff toxin NEGATIVE NEGATIVE Final   C Diff interpretation Results are indeterminate. See PCR results.  Final    Comment: VALID Performed at [facility], [address] [place], [place] [address]   SARS Coronavirus 2 by RT PCR (hospital order, performed in [facility] hospital lab) Nasopharyngeal Nasopharyngeal Swab     Status: None   Collection Time: 01/03/20 12:37 AM   Specimen: Nasopharyngeal Swab  Result Value Ref Range Status   SARS Coronavirus 2 NEGATIVE NEGATIVE Final    Comment: (NOTE) SARS-CoV-2 target nucleic acids are NOT DETECTED.  The SARS-CoV-2 RNA is generally detectable in upper and lower respiratory specimens during the acute phase of infection. The lowest concentration of SARS-CoV-2 viral copies this assay can detect is 250 copies / mL. A negative result does not preclude SARS-CoV-2 infection and should not be used as the sole basis for treatment or other patient management decisions.  A negative result may occur with improper specimen collection / handling, submission of specimen other than nasopharyngeal swab, presence of viral mutation(s) within the areas targeted by this assay, and inadequate number of viral copies (<250 copies / mL). A negative result must be combined with clinical observations, patient history, and epidemiological information.  Fact Sheet for Patients:   StrictlyIdeas.no  Fact Sheet for Healthcare  Providers: BankingDealers.co.za  This test is not yet approved or  cleared by the [place] FDA and has been authorized for detection and/or diagnosis of SARS-CoV-2 by FDA under an Emergency Use Authorization (EUA).  This EUA will remain in effect (meaning this test can be used) for the duration of  the COVID-19 declaration under Section 564(b)(1) of the Act, 21 U.S.C. section 360bbb-3(b)(1), unless the authorization is terminated or revoked sooner.  Performed at [facility], [address], [place], [place] [address]   C. Diff by PCR, Reflexed     Status: None   Collection Time: 01/03/20 12:37 AM  Result Value Ref Range Status   Toxigenic C. Difficile by PCR NEGATIVE NEGATIVE Final    Comment: Patient is colonized with non toxigenic C. difficile. May not need treatment unless significant symptoms are present. Performed at [facility], [address], [place], [place] [address]          Radiology Studies: DG Chest 2 View  Result Date: 01/02/2020 CLINICAL DATA:  Shortness of breath. Additional history provided: Shortness of breath and weakness for 8 days. EXAM: CHEST - 2 VIEW COMPARISON:  Chest CT 07/27/2016, report from prior chest radiograph 08/06/2018 and earlier (images unavailable). FINDINGS: Heart size within normal limits. Aortic atherosclerosis. There is no appreciable airspace consolidation or pulmonary edema. Subtle changes of interstitial lung disease were better appreciated on the prior chest CT of 07/27/2016. Right apical pleuroparenchymal scarring. No evidence of pleural effusion or pneumothorax. No acute bony abnormality identified. Redemonstrated S-shaped thoracolumbar curvature. IMPRESSION: No evidence of acute cardiopulmonary abnormality. Subtle changes of interstitial lung disease were better appreciated on the chest CT of 07/27/2016. Please refer to this prior examination for further description. Right apical  pleuroparenchymal scarring. Aortic Atherosclerosis (ICD10-I70.0). Redemonstrated S-shaped thoracolumbar spinal curvature. Electronically Signed   By: Kellie Simmering DO   On: 01/02/2020 17:33   CT ABDOMEN PELVIS W CONTRAST  Result Date: 01/02/2020 CLINICAL DATA:  59 year old female with flank pain. Concern for kidney stone. EXAM: CT ABDOMEN AND PELVIS WITH CONTRAST TECHNIQUE: Multidetector CT imaging of the abdomen and pelvis was performed using the standard protocol following bolus administration of intravenous contrast. CONTRAST:  105mL OMNIPAQUE IOHEXOL 300 MG/ML  SOLN COMPARISON:  Abdominal ultrasound dated 08/04/2016. FINDINGS: Lower chest: The visualized lung bases are clear. No intra-abdominal free air. Trace free fluid in the pelvis. Hepatobiliary: No focal liver abnormality is seen. No gallstones, gallbladder wall thickening, or biliary dilatation. Pancreas: Unremarkable. No pancreatic ductal dilatation or surrounding inflammatory changes. Spleen: Normal in size without focal abnormality. Adrenals/Urinary Tract: The adrenal glands are unremarkable. There is no hydronephrosis on either side. There is symmetric enhancement and excretion of contrast by both kidneys. The visualized ureters and urinary bladder appear unremarkable. Stomach/Bowel: There is diffuse thickened appearance of the colon which may be related to underdistention or represent mild colitis. Clinical correlation is recommended. There is no bowel obstruction. The appendix is normal. Vascular/Lymphatic: Mild aortoiliac atherosclerotic disease. The IVC is unremarkable. No portal venous gas. There is no adenopathy. Reproductive: The uterus and ovaries are grossly unremarkable. Other: None Musculoskeletal: No acute or significant osseous findings. IMPRESSION: Underdistention of the colon versus mild colitis. Clinical correlation is recommended. No bowel obstruction. Normal appendix. Electronically Signed   By: Anner Crete M.D.   On:  01/02/2020 22:10        Scheduled Meds: . cholestyramine  4 g Oral TID  . enoxaparin (LOVENOX) injection  40 mg Subcutaneous Q24H  . famotidine  40 mg Oral QHS  . fentaNYL  1 patch Transdermal Q72H  . gabapentin  400 mg Oral QHS  . mycophenolate  1,000 mg Oral BID  . pantoprazole  40 mg Oral Daily  . saccharomyces boulardii  250 mg Oral BID  . sodium chloride flush  3  mL Intravenous Once  . sodium chloride flush  3 mL Intravenous Q12H   Continuous Infusions: . 0.9 % NaCl with KCl 40 mEq / L 20 mL/hr at 01/04/20 1156  . sodium chloride       LOS: 1 day     Georgette Shell, MD  01/04/2020, 12:46 PM

## 2020-01-05 LAB — CBC
HCT: 31.5 % — ABNORMAL LOW (ref 36.0–46.0)
Hemoglobin: 10.4 g/dL — ABNORMAL LOW (ref 12.0–15.0)
MCH: 28 pg (ref 26.0–34.0)
MCHC: 33 g/dL (ref 30.0–36.0)
MCV: 84.9 fL (ref 80.0–100.0)
Platelets: 183 10*3/uL (ref 150–400)
RBC: 3.71 MIL/uL — ABNORMAL LOW (ref 3.87–5.11)
RDW: 14.3 % (ref 11.5–15.5)
WBC: 4 10*3/uL (ref 4.0–10.5)
nRBC: 0 % (ref 0.0–0.2)

## 2020-01-05 LAB — COMPREHENSIVE METABOLIC PANEL
ALT: 9 U/L (ref 0–44)
AST: 19 U/L (ref 15–41)
Albumin: 3.3 g/dL — ABNORMAL LOW (ref 3.5–5.0)
Alkaline Phosphatase: 30 U/L — ABNORMAL LOW (ref 38–126)
Anion gap: 6 (ref 5–15)
BUN: <5 mg/dL — ABNORMAL LOW (ref 6–20)
CO2: 24 mmol/L (ref 22–32)
Calcium: 8.3 mg/dL — ABNORMAL LOW (ref 8.9–10.3)
Chloride: 114 mmol/L — ABNORMAL HIGH (ref 98–111)
Creatinine, Ser: 0.58 mg/dL (ref 0.44–1.00)
GFR calc Af Amer: >60 mL/min (ref >60)
GFR calc non Af Amer: >60 mL/min (ref >60)
Glucose, Bld: 105 mg/dL — ABNORMAL HIGH (ref 70–99)
Potassium: 4.3 mmol/L (ref 3.5–5.1)
Sodium: 144 mmol/L (ref 135–145)
Total Bilirubin: 0.8 mg/dL (ref 0.3–1.2)
Total Protein: 5.6 g/dL — ABNORMAL LOW (ref 6.5–8.1)

## 2020-01-05 LAB — GLUCOSE, CAPILLARY
Glucose-Capillary: 100 mg/dL — ABNORMAL HIGH (ref 70–99)
Glucose-Capillary: 113 mg/dL — ABNORMAL HIGH (ref 70–99)
Glucose-Capillary: 144 mg/dL — ABNORMAL HIGH (ref 70–99)
Glucose-Capillary: 76 mg/dL (ref 70–99)
Glucose-Capillary: 77 mg/dL (ref 70–99)
Glucose-Capillary: 99 mg/dL (ref 70–99)

## 2020-01-05 MED ORDER — FENTANYL 12 MCG/HR TD PT72
1.0000 | MEDICATED_PATCH | TRANSDERMAL | Status: DC
  Administered 2020-01-05 (×2): 1 via TRANSDERMAL
  Filled 2020-01-05 (×2): qty 1

## 2020-01-05 MED ORDER — FENTANYL 12 MCG/HR TD PT72
1.0000 | MEDICATED_PATCH | TRANSDERMAL | Status: DC
  Filled 2020-01-05: qty 1

## 2020-01-05 MED ORDER — SODIUM CHLORIDE 0.9 % IV SOLN: INTRAVENOUS | Status: DC

## 2020-01-05 NOTE — Progress Notes (Signed)
PROGRESS NOTE    Sophia Bean  DJM:[id number] DOB: [birth year] DOA: 01/02/2020 PCP: Perrin Maltese, MD   Brief Narrative: Sophia Bean is a 59 y.o. female with medical history significant for scleroderma, interstitial lung disease, chronic hypoxic respiratory failure, and chronic pain, presenting to emergency department for evaluation of diarrhea with abdominal discomfort and palpitations.  Patient reports that she developed watery diarrhea, nausea without vomiting, abdominal discomfort, and decreased appetite over the course of the past 1 to 2 weeks.  She denies any melena, hematochezia, or hematemesis.  Her abdominal pain is mainly on the right side.  She denies any fevers or chills.  She has felt some palpitations recently but denies chest pain.  She reports that her chronic dyspnea seems to be worse but denies any new cough.  She reports similar symptoms previously that were ultimately attributed to E. coli infection.  ED Course: Upon arrival to the ED, patient is found to be afebrile, saturating well on her usual 2 L/min of supplemental oxygen, mildly tachycardic, and with blood pressure 100/60.  EKG features sinus tachycardia with rate 109, RBBB, and LPFB.  Chest x-rays negative for acute cardiopulmonary disease.  CT of the abdomen and pelvis is negative for SBO but notable for under distention of the colon versus colitis.  Chemistry panel with glucose 67, bicarbonate 19, and total bilirubin 1.9.  CBC unremarkable.  Lipase was normal.  High-sensitivity troponin normal x2.  Patient was given a liter of IV fluids, continued to have mild hypoglycemia, and was started on dextrose fluids.  Assessment & Plan:   Principal Problem:   Diarrhea Active Problems:   Hypoglycemia   Chronic respiratory failure with hypoxia (HCC)   Chronic pain   Scleroderma (HCC)   Interstitial lung disease ([place])   #1 diarrhea secondary to Shigella toxin producing E. coli.  We'll continue supportive treatment.  She is able  to tolerate a diet denies any nausea vomiting.  Diarrhea is slowing down she had 4 loose bowel movements yesterday which is down from 8 the previous day.  However her blood pressure is still soft and she is lightheaded.  Continue IV fluids.  Continue probiotics.  Continue IV fluids probiotics Will hold off on starting any antibiotics due to high risk for HUS.  #2 hypoglycemia due to decreased p.o. intake.  Her A1c is 5.4. CBG (last 3)  Recent Labs    01/05/20 0403 01/05/20 0756 01/05/20 1145  GLUCAP 100* 77 144*    #3 history of scleroderma followed by rheumatology as an outpatient on CellCept  #4 chronic hypoxic respiratory failure with interstitial lung disease  #5 chronic pain syndrome continue home meds.  #6 chronic anemia hemoglobin 9.1 down from 11.5 with hemodilution.  #7 hypotension her blood pressure remains soft in spite of IV hydration ongoing diarrhea.  Continue IV fluids.  Estimated body mass index is 18.96 kg/m as calculated from the following:   Height as of this encounter: 5\' 7"  (1.702 m).   Weight as of this encounter: 54.9 kg.  DVT prophylaxis: Lovenox Code Status: Full code Family Communication: None at bedside Disposition Plan:  Status is: Inpatient  Dispo: The patient is from: Home              Anticipated d/c is to: Home              Anticipated d/c date is:1 day              Patient currently is not medically stable to  d/c.  Due to ongoing diarrhea and dizziness    Consultants: Discussed with ID over the phone  Procedures:  Antimicrobials: Subjective: Patient resting in bed she appears to be better than yesterday.  However she reports having 8 loose BMs yesterday.  Denies abdominal pain nausea vomiting. Objective: Vitals:   01/04/20 1141 01/04/20 2052 01/05/20 0536 01/05/20 1147  BP: (!) 98/52 (!) 106/57 (!) 106/54 (!) 102/52  Pulse: 81 79 79 80  Resp: 16 20 20 16   Temp: 98.6 F (37 C) 98.3 F (36.8 C) 98.6 F (37 C) (!) 97.5 F (36.4 C)    TempSrc: Oral Oral Oral Oral  SpO2: 96% 96% 95% 97%  Weight:      Height:        Intake/Output Summary (Last 24 hours) at 01/05/2020 1358 Last data filed at 01/05/2020 6256 Gross per 24 hour  Intake 1711.86 ml  Output --  Net 1711.86 ml   Filed Weights   01/02/20 1618 01/03/20 0252 01/04/20 0500  Weight: 52.2 kg 54.9 kg 54.9 kg    Examination:  General exam: Appears calm and comfortable  Respiratory system: Clear to auscultation. Respiratory effort normal. Cardiovascular system: S1 & S2 heard, RRR. No JVD, murmurs, rubs, gallops or clicks. No pedal edema. Gastrointestinal system: Abdomen is nondistended, soft and nontender. No organomegaly or masses felt. Normal bowel sounds heard. Central nervous system: Alert and oriented. No focal neurological deficits. Extremities: Symmetric 5 x 5 power. Skin: No rashes, lesions or ulcers Psychiatry: Judgement and insight appear normal. Mood & affect appropriate.     Data Reviewed: I have personally reviewed following labs and imaging studies  CBC: Recent Labs  Lab 01/02/20 1633 01/03/20 0451 01/04/20 0434 01/05/20 0411  WBC 7.7 6.2 3.6* 4.0  HGB 12.3 11.5* 9.1* 10.4*  HCT 37.5 34.7* 28.2* 31.5*  MCV 84.8 83.8 85.2 84.9  PLT 239 215 166 389   Basic Metabolic Panel: Recent Labs  Lab 01/02/20 1633 01/02/20 1902 01/03/20 0451 01/04/20 0658 01/05/20 0411  NA 136  --  135 142 144  K 3.8  --  3.5 4.1 4.3  CL 102  --  104 115* 114*  CO2 19*  --  21* 21* 24  GLUCOSE 67*  --  159* 79 105*  BUN 9  --  8 <5* <5*  CREATININE 0.69  --  0.67 0.51 0.58  CALCIUM 9.3  --  8.8* 8.1* 8.3*  MG  --  1.7  --   --   --    GFR: Estimated Creatinine Clearance: 66.4 mL/min (by C-G formula based on SCr of 0.58 mg/dL). Liver Function Tests: Recent Labs  Lab 01/02/20 1902 01/03/20 0451 01/04/20 0658 01/05/20 0411  AST 22 20 17 19   ALT 13 11 9 9   ALKPHOS 34* 31* 27* 30*  BILITOT 1.9* 1.2 0.9 0.8  PROT 7.4 6.6 5.3* 5.6*  ALBUMIN 4.4  3.9 3.2* 3.3*   Recent Labs  Lab 01/02/20 1633  LIPASE 35   No results for input(s): AMMONIA in the last 168 hours. Coagulation Profile: No results for input(s): INR, PROTIME in the last 168 hours. Cardiac Enzymes: No results for input(s): CKTOTAL, CKMB, CKMBINDEX, TROPONINI in the last 168 hours. BNP (last 3 results) No results for input(s): PROBNP in the last 8760 hours. HbA1C: Recent Labs    01/03/20 0451  HGBA1C 5.4   CBG: Recent Labs  Lab 01/04/20 2126 01/05/20 0038 01/05/20 0403 01/05/20 0756 01/05/20 1145  GLUCAP 107* 76 100* 77 144*  Lipid Profile: No results for input(s): CHOL, HDL, LDLCALC, TRIG, CHOLHDL, LDLDIRECT in the last 72 hours. Thyroid Function Tests: No results for input(s): TSH, T4TOTAL, FREET4, T3FREE, THYROIDAB in the last 72 hours. Anemia Panel: No results for input(s): VITAMINB12, FOLATE, FERRITIN, TIBC, IRON, RETICCTPCT in the last 72 hours. Sepsis Labs: No results for input(s): PROCALCITON, LATICACIDVEN in the last 168 hours.  Recent Results (from the past 240 hour(s))  Gastrointestinal Panel by PCR , Stool     Status: Abnormal   Collection Time: 01/03/20 12:37 AM   Specimen: Stool  Result Value Ref Range Status   Campylobacter species NOT DETECTED NOT DETECTED Final   Plesimonas shigelloides NOT DETECTED NOT DETECTED Final   Salmonella species NOT DETECTED NOT DETECTED Final   Yersinia enterocolitica NOT DETECTED NOT DETECTED Final   Vibrio species NOT DETECTED NOT DETECTED Final   Vibrio cholerae NOT DETECTED NOT DETECTED Final   Enteroaggregative E coli (EAEC) NOT DETECTED NOT DETECTED Final   Enterotoxigenic E coli (ETEC) NOT DETECTED NOT DETECTED Final   Shiga like toxin producing E coli (STEC) DETECTED (A) NOT DETECTED Final    Comment: CRITICAL RESULT CALLED TO, READ BACK BY AND VERIFIED WITH: DAVID BESANTI @300  01/03/2020 TTG     E. coli O157 NOT DETECTED NOT DETECTED Final   Shigella/Enteroinvasive E coli (EIEC) NOT DETECTED  NOT DETECTED Final   Cryptosporidium NOT DETECTED NOT DETECTED Final   Cyclospora cayetanensis NOT DETECTED NOT DETECTED Final   Entamoeba histolytica NOT DETECTED NOT DETECTED Final   Giardia lamblia NOT DETECTED NOT DETECTED Final   Adenovirus F40/41 NOT DETECTED NOT DETECTED Final   Astrovirus NOT DETECTED NOT DETECTED Final   Norovirus GI/GII NOT DETECTED NOT DETECTED Final   Rotavirus A NOT DETECTED NOT DETECTED Final   Sapovirus (I, II, IV, and V) NOT DETECTED NOT DETECTED Final    Comment: Performed at [facility], [address] [place], [place] [address]  C Difficile Quick Screen w PCR reflex     Status: Abnormal   Collection Time: 01/03/20 12:37 AM   Specimen: Stool  Result Value Ref Range Status   C Diff antigen POSITIVE (A) NEGATIVE Final   C Diff toxin NEGATIVE NEGATIVE Final   C Diff interpretation Results are indeterminate. See PCR results.  Final    Comment: VALID Performed at [facility], [address] [place],  [address]   SARS Coronavirus 2 by RT PCR (hospital order, performed in [facility] hospital lab) Nasopharyngeal Nasopharyngeal Swab     Status: None   Collection Time: 01/03/20 12:37 AM   Specimen: Nasopharyngeal Swab  Result Value Ref Range Status   SARS Coronavirus 2 NEGATIVE NEGATIVE Final    Comment: (NOTE) SARS-CoV-2 target nucleic acids are NOT DETECTED.  The SARS-CoV-2 RNA is generally detectable in upper and lower respiratory specimens during the acute phase of infection. The lowest concentration of SARS-CoV-2 viral copies this assay can detect is 250 copies / mL. A negative result does not preclude SARS-CoV-2 infection and should not be used as the sole basis for treatment or other patient management decisions.  A negative result may occur with improper specimen collection / handling, submission of specimen other than nasopharyngeal swab, presence of viral mutation(s) within the areas targeted by this assay, and  inadequate number of viral copies (<250 copies / mL). A negative result must be combined with clinical observations, patient history, and epidemiological information.  Fact Sheet for Patients:   StrictlyIdeas.no  Fact Sheet  for Healthcare Providers: BankingDealers.co.za  This test is not yet approved or  cleared by the [place] and has been authorized for detection and/or diagnosis of SARS-CoV-2 by FDA under an Emergency Use Authorization (EUA).  This EUA will remain in effect (meaning this test can be used) for the duration of the COVID-19 declaration under Section 564(b)(1) of the Act, 21 U.S.C. section 360bbb-3(b)(1), unless the authorization is terminated or revoked sooner.  Performed at [facility], [address] [place], [place] [address]   C. Diff by PCR, Reflexed     Status: None   Collection Time: 01/03/20 12:37 AM  Result Value Ref Range Status   Toxigenic C. Difficile by PCR NEGATIVE NEGATIVE Final    Comment: Patient is colonized with non toxigenic C. difficile. May not need treatment unless significant symptoms are present. Performed at [facility], [address] [place], [place] [address]          Radiology Studies: No results found.      Scheduled Meds: . cholestyramine  4 g Oral TID  . enoxaparin (LOVENOX) injection  40 mg Subcutaneous Q24H  . famotidine  40 mg Oral QHS  . fentaNYL  1 patch Transdermal Q48H  . gabapentin  400 mg Oral QHS  . mycophenolate  1,000 mg Oral BID  . pantoprazole  40 mg Oral Daily  . saccharomyces boulardii  250 mg Oral BID  . sodium chloride flush  3 mL Intravenous Once  . sodium chloride flush  3 mL Intravenous Q12H   Continuous Infusions: . sodium chloride 100 mL/hr at 01/05/20 1323  . 0.9 % NaCl with KCl 40 mEq / L Stopped (01/05/20 1326)  . sodium chloride       LOS: 2 days     Georgette Shell, MD  01/05/2020, 1:58 PM

## 2020-01-06 LAB — GLUCOSE, CAPILLARY
Glucose-Capillary: 115 mg/dL — ABNORMAL HIGH (ref 70–99)
Glucose-Capillary: 76 mg/dL (ref 70–99)
Glucose-Capillary: 76 mg/dL (ref 70–99)
Glucose-Capillary: 79 mg/dL (ref 70–99)
Glucose-Capillary: 79 mg/dL (ref 70–99)
Glucose-Capillary: 98 mg/dL (ref 70–99)
Glucose-Capillary: 98 mg/dL (ref 70–99)

## 2020-01-06 MED ORDER — ONDANSETRON HCL 4 MG PO TABS: 4.0000 mg | ORAL_TABLET | Freq: Four times a day (QID) | ORAL | 0 refills | Status: AC | PRN

## 2020-01-06 MED ORDER — SACCHAROMYCES BOULARDII 250 MG PO CAPS: 250.0000 mg | ORAL_CAPSULE | Freq: Two times a day (BID) | ORAL | 4 refills | Status: AC

## 2020-01-06 MED ORDER — BLOOD GLUCOSE MONITOR KIT: PACK | 0 refills | Status: AC

## 2020-01-06 NOTE — Care Management Important Message (Signed)
Important Message  Patient Details  Name: Sophia Bean MRN: [id number] Date of Birth: 09/20/1960   Medicare Important Message Given:  Yes     Beverly Sessions, RN 01/06/2020, 1:20 PM

## 2020-01-06 NOTE — Progress Notes (Signed)
[name] to be D/C'd home with husband per MD order.  Discussed prescriptions and follow up appointments with the patient. Prescriptions given to patient, medication list explained in detail. Pt verbalized understanding.  Allergies as of 01/06/2020       Reactions   Flonase [fluticasone] Shortness Of Breath, Rash   Minocycline    Other reaction(s): Other (See Comments) She has not been able to eat, has had severe acid reflux, has had some chest pain, and has also had breathing problems.   Bactrim [sulfamethoxazole-trimethoprim]    Morphine And Related    Penicillins    Duloxetine Hcl Nausea Only, Palpitations        Medication List     STOP taking these medications    amLODipine 5 MG tablet Commonly known as: NORVASC   amlodipine-atorvastatin 10-20 MG tablet Commonly known as: CADUET   Fosamax 70 MG tablet Generic drug: alendronate   gentamicin ointment 0.1 % Commonly known as: GARAMYCIN   levofloxacin 500 MG tablet Commonly known as: LEVAQUIN   LORazepam 0.5 MG tablet Commonly known as: ATIVAN   oxyCODONE-acetaminophen 5-325 MG tablet Commonly known as: PERCOCET/ROXICET   silver sulfADIAZINE 1 % cream Commonly known as: SILVADENE       TAKE these medications    blood glucose meter kit and supplies Kit Dispense based on patient and insurance preference. Use up to four times daily as directed. (FOR ICD-9 250.00, 250.01).   Calcium High Potency/Vitamin D 600-200 MG-UNIT Tabs Generic drug: Calcium Carbonate-Vitamin D Take 1 tablet by mouth 2 (two) times daily.   famotidine 40 MG tablet Commonly known as: PEPCID Take 40 mg by mouth at bedtime.   fentaNYL 12 MCG/HR Commonly known as: [place] 1 patch onto the skin every other day.   gabapentin 300 MG capsule Commonly known as: NEURONTIN Take 300 mg by mouth at bedtime. Takes with a 100 mg at bedtime   gabapentin 100 MG capsule Commonly known as: NEURONTIN Take 100 mg by mouth at bedtime.  Takes with a 300 mg at bedtime   mycophenolate 500 MG tablet Commonly known as: CELLCEPT Take 1,000 mg by mouth 2 (two) times daily.   Nitro-Bid 2 % ointment Generic drug: nitroGLYCERIN Place onto the skin.   ondansetron 8 MG tablet Commonly known as: ZOFRAN Take by mouth. What changed: Another medication with the same name was added. Make sure you understand how and when to take each.   ondansetron 4 MG tablet Commonly known as: ZOFRAN Take 1 tablet (4 mg total) by mouth every 6 (six) hours as needed for nausea. What changed: You were already taking a medication with the same name, and this prescription was added. Make sure you understand how and when to take each.   pantoprazole 40 MG tablet Commonly known as: PROTONIX TAKE 1 TABLET ORALLY DAILY IN THE AM 1 HOUR PRIOR TO BREAKFAST   Reclast 5 MG/100ML Soln injection Generic drug: zoledronic acid Inject 5 mg into the vein once.   saccharomyces boulardii 250 MG capsule Commonly known as: FLORASTOR Take 1 capsule (250 mg total) by mouth 2 (two) times daily.   Vitamin D-1000 Max St 25 MCG (1000 UT) tablet Generic drug: Cholecalciferol Take 1,000 Units by mouth daily.        Vitals:   01/06/20 0842 01/06/20 1119  BP: (!) 99/58 (!) 102/54  Pulse: 62 72  Resp: 16 20  Temp: 98 F (36.7 C) 97.9 F (36.6 C)  SpO2: 98% 97%    Skin  clean, dry and intact without evidence of skin break down, no evidence of skin tears noted. IV catheter discontinued intact. Site without signs and symptoms of complications. Dressing and pressure applied. Pt denies pain at this time. No complaints noted.  An After Visit Summary was printed and given to the patient. Patient escorted via [place], and D/C home via private auto.  Sophia Bean

## 2020-01-06 NOTE — Discharge Summary (Addendum)
Physician Discharge Summary  Virdia Ziesmer BSJ:[id number] DOB: 1960/09/07 DOA: 01/02/2020  PCP: Perrin Maltese, MD  Admit date: 01/02/2020 Discharge date: 01/06/2020  Admitted From: home Disposition: home Recommendations for Outpatient Follow-up:  1. Follow up with PCP in 1-2 weeks 2. Please obtain BMP/CBC in one week  Home Health:none Equipment/Devices:none  Discharge Condition:stable CODE STATUS:full Diet recommendation:cardiac Brief/Interim Summary:Krislynn Riggsis a 59 y.o.femalewith medical history significant forscleroderma, interstitial lung disease, chronic hypoxic respiratory failure, and chronic pain, presenting to emergency department for evaluation of diarrhea with abdominal discomfort and palpitations. Patient reports that she developed watery diarrhea, nausea without vomiting, abdominal discomfort, and decreased appetite over the course of the past 1 to 2 weeks. She denies any melena, hematochezia, or hematemesis. Her abdominal pain is mainly on the right side. She denies any fevers or chills. She has felt some palpitations recently but denies chest pain. She reports that her chronic dyspnea seems to be worse but denies any new cough. She reports similar symptoms previously that were ultimately attributed to E. coli infection.  ED Course:Upon arrival to the ED, patient is found to be afebrile, saturating well on her usual 2 L/min of supplemental oxygen, mildly tachycardic, and with blood pressure 100/60. EKG features sinus tachycardia with rate 109, RBBB, and LPFB.Chest x-rays negative for acute cardiopulmonary disease. CT of the abdomen and pelvis is negative for SBO but notable for under distention of the colon versus colitis. Chemistry panel with glucose 67, bicarbonate 19, and total bilirubin 1.9. CBC unremarkable. Lipase was normal. High-sensitivity troponin normal x2. Patient was given a liter of IV fluids, continued to have mild hypoglycemia, and was started on  dextrose fluids.  Discharge Diagnoses:  Principal Problem:   Diarrhea Active Problems:   Hypoglycemia   Chronic respiratory failure with hypoxia (HCC)   Chronic pain   Scleroderma (HCC)   Interstitial lung disease ([place])   #1 diarrhea secondary to Shigella toxin producing E. coli. She was treated with IV fluids and Zofran and probiotics. She was not given any antibiotics due to high risk for HUS. Her symptoms improved and stabilized prior to discharge.  Her blood pressure was low initially and then improved but still remained within the low normal range. I have stopped her Norvasc on discharge.  #2 hypoglycemia-resolved.  #3 history of scleroderma followed by rheumatology as an outpatient on CellCept. Encourage her to follow-up at [facility].  #4 chronic hypoxic respiratory failure with interstitial lung disease  #5 chronic pain syndrome continue home meds.  #6 chronic anemia hemoglobin 9.1 down from 11.5 with hemodilution.  #7 hypotension resolved with IV fluids.  Estimated body mass index is 19.25 kg/m as calculated from the following:   Height as of this encounter: 5' 7"  (1.702 m).   Weight as of this encounter: 55.7 kg.  Discharge Instructions  Discharge Instructions    Call MD for:  difficulty breathing, headache or visual disturbances   Complete by: As directed    Call MD for:  persistant nausea and vomiting   Complete by: As directed    Call MD for:  redness, tenderness, or signs of infection (pain, swelling, redness, odor or green/yellow discharge around incision site)   Complete by: As directed    Call MD for:  severe uncontrolled pain   Complete by: As directed    Call MD for:  temperature >100.4   Complete by: As directed    Diet - low sodium heart healthy   Complete by: As directed    Increase activity  slowly   Complete by: As directed      Allergies as of 01/06/2020      Reactions   Flonase [fluticasone] Shortness Of Breath, Rash   Minocycline    Other  reaction(s): Other (See Comments) She has not been able to eat, has had severe acid reflux, has had some chest pain, and has also had breathing problems.   Bactrim [sulfamethoxazole-trimethoprim]    Morphine And Related    Penicillins    Duloxetine Hcl Nausea Only, Palpitations      Medication List    STOP taking these medications   amLODipine 5 MG tablet Commonly known as: NORVASC   amlodipine-atorvastatin 10-20 MG tablet Commonly known as: CADUET   Fosamax 70 MG tablet Generic drug: alendronate   gentamicin ointment 0.1 % Commonly known as: GARAMYCIN   levofloxacin 500 MG tablet Commonly known as: LEVAQUIN   LORazepam 0.5 MG tablet Commonly known as: ATIVAN   oxyCODONE-acetaminophen 5-325 MG tablet Commonly known as: PERCOCET/ROXICET   silver sulfADIAZINE 1 % cream Commonly known as: SILVADENE     TAKE these medications   blood glucose meter kit and supplies Kit Dispense based on patient and insurance preference. Use up to four times daily as directed. (FOR ICD-9 250.00, 250.01).   Calcium High Potency/Vitamin D 600-200 MG-UNIT Tabs Generic drug: Calcium Carbonate-Vitamin D Take 1 tablet by mouth 2 (two) times daily.   famotidine 40 MG tablet Commonly known as: PEPCID Take 40 mg by mouth at bedtime.   fentaNYL 12 MCG/HR Commonly known as: [place] 1 patch onto the skin every other day.   gabapentin 300 MG capsule Commonly known as: NEURONTIN Take 300 mg by mouth at bedtime. Takes with a 100 mg at bedtime   gabapentin 100 MG capsule Commonly known as: NEURONTIN Take 100 mg by mouth at bedtime. Takes with a 300 mg at bedtime   mycophenolate 500 MG tablet Commonly known as: CELLCEPT Take 1,000 mg by mouth 2 (two) times daily.   Nitro-Bid 2 % ointment Generic drug: nitroGLYCERIN Place onto the skin.   ondansetron 8 MG tablet Commonly known as: ZOFRAN Take by mouth. What changed: Another medication with the same name was added. Make sure you  understand how and when to take each.   ondansetron 4 MG tablet Commonly known as: ZOFRAN Take 1 tablet (4 mg total) by mouth every 6 (six) hours as needed for nausea. What changed: You were already taking a medication with the same name, and this prescription was added. Make sure you understand how and when to take each.   pantoprazole 40 MG tablet Commonly known as: PROTONIX TAKE 1 TABLET ORALLY DAILY IN THE AM 1 HOUR PRIOR TO BREAKFAST   Reclast 5 MG/100ML Soln injection Generic drug: zoledronic acid Inject 5 mg into the vein once.   saccharomyces boulardii 250 MG capsule Commonly known as: FLORASTOR Take 1 capsule (250 mg total) by mouth 2 (two) times daily.   Vitamin D-1000 Max St 25 MCG (1000 UT) tablet Generic drug: Cholecalciferol Take 1,000 Units by mouth daily.       Allergies  Allergen Reactions  . Flonase [Fluticasone] Shortness Of Breath and Rash  . Minocycline     Other reaction(s): Other (See Comments) She has not been able to eat, has had severe acid reflux, has had some chest pain, and has also had breathing problems.  . Bactrim [Sulfamethoxazole-Trimethoprim]   . Morphine And Related   . Penicillins   . Duloxetine Hcl Nausea Only  and Palpitations    Consultations:  None   Procedures/Studies: DG Chest 2 View  Result Date: 01/02/2020 CLINICAL DATA:  Shortness of breath. Additional history provided: Shortness of breath and weakness for 8 days. EXAM: CHEST - 2 VIEW COMPARISON:  Chest CT 07/27/2016, report from prior chest radiograph 08/06/2018 and earlier (images unavailable). FINDINGS: Heart size within normal limits. Aortic atherosclerosis. There is no appreciable airspace consolidation or pulmonary edema. Subtle changes of interstitial lung disease were better appreciated on the prior chest CT of 07/27/2016. Right apical pleuroparenchymal scarring. No evidence of pleural effusion or pneumothorax. No acute bony abnormality identified. Redemonstrated  S-shaped thoracolumbar curvature. IMPRESSION: No evidence of acute cardiopulmonary abnormality. Subtle changes of interstitial lung disease were better appreciated on the chest CT of 07/27/2016. Please refer to this prior examination for further description. Right apical pleuroparenchymal scarring. Aortic Atherosclerosis (ICD10-I70.0). Redemonstrated S-shaped thoracolumbar spinal curvature. Electronically Signed   By: Kellie Simmering DO   On: 01/02/2020 17:33   CT ABDOMEN PELVIS W CONTRAST  Result Date: 01/02/2020 CLINICAL DATA:  59 year old female with flank pain. Concern for kidney stone. EXAM: CT ABDOMEN AND PELVIS WITH CONTRAST TECHNIQUE: Multidetector CT imaging of the abdomen and pelvis was performed using the standard protocol following bolus administration of intravenous contrast. CONTRAST:  51m OMNIPAQUE IOHEXOL 300 MG/ML  SOLN COMPARISON:  Abdominal ultrasound dated 08/04/2016. FINDINGS: Lower chest: The visualized lung bases are clear. No intra-abdominal free air. Trace free fluid in the pelvis. Hepatobiliary: No focal liver abnormality is seen. No gallstones, gallbladder wall thickening, or biliary dilatation. Pancreas: Unremarkable. No pancreatic ductal dilatation or surrounding inflammatory changes. Spleen: Normal in size without focal abnormality. Adrenals/Urinary Tract: The adrenal glands are unremarkable. There is no hydronephrosis on either side. There is symmetric enhancement and excretion of contrast by both kidneys. The visualized ureters and urinary bladder appear unremarkable. Stomach/Bowel: There is diffuse thickened appearance of the colon which may be related to underdistention or represent mild colitis. Clinical correlation is recommended. There is no bowel obstruction. The appendix is normal. Vascular/Lymphatic: Mild aortoiliac atherosclerotic disease. The IVC is unremarkable. No portal venous gas. There is no adenopathy. Reproductive: The uterus and ovaries are grossly unremarkable.  Other: None Musculoskeletal: No acute or significant osseous findings. IMPRESSION: Underdistention of the colon versus mild colitis. Clinical correlation is recommended. No bowel obstruction. Normal appendix. Electronically Signed   By: AAnner CreteM.D.   On: 01/02/2020 22:10    (Echo, Carotid, EGD, Colonoscopy, ERCP)    Subjective:  She is awake alert resting in bed anxious to go home no new complaints had 4  bowel movements yesterday starting to form. Discharge Exam: Vitals:   01/06/20 0842 01/06/20 1119  BP: (!) 99/58 (!) 102/54  Pulse: 62 72  Resp: 16 20  Temp: 98 F (36.7 C) 97.9 F (36.6 C)  SpO2: 98% 97%   Vitals:   01/06/20 0500 01/06/20 0557 01/06/20 0842 01/06/20 1119  BP:  101/62 (!) 99/58 (!) 102/54  Pulse:  67 62 72  Resp:  16 16 20   Temp:  98.7 F (37.1 C) 98 F (36.7 C) 97.9 F (36.6 C)  TempSrc:  Oral Oral Oral  SpO2:  98% 98% 97%  Weight: 55.7 kg     Height:        General: Pt is alert, awake, not in acute distress Cardiovascular: RRR, S1/S2 +, no rubs, no gallops Respiratory: CTA bilaterally, no wheezing, no rhonchi Abdominal: Soft, NT, ND, bowel sounds + Extremities: no edema, no cyanosis  The results of significant diagnostics from this hospitalization (including imaging, microbiology, ancillary and laboratory) are listed below for reference.     Microbiology: Recent Results (from the past 240 hour(s))  Gastrointestinal Panel by PCR , Stool     Status: Abnormal   Collection Time: 01/03/20 12:37 AM   Specimen: Stool  Result Value Ref Range Status   Campylobacter species NOT DETECTED NOT DETECTED Final   Plesimonas shigelloides NOT DETECTED NOT DETECTED Final   Salmonella species NOT DETECTED NOT DETECTED Final   Yersinia enterocolitica NOT DETECTED NOT DETECTED Final   Vibrio species NOT DETECTED NOT DETECTED Final   Vibrio cholerae NOT DETECTED NOT DETECTED Final   Enteroaggregative E coli (EAEC) NOT DETECTED NOT DETECTED Final    Enterotoxigenic E coli (ETEC) NOT DETECTED NOT DETECTED Final   Shiga like toxin producing E coli (STEC) DETECTED (A) NOT DETECTED Final    Comment: CRITICAL RESULT CALLED TO, READ BACK BY AND VERIFIED WITH: DAVID BESANTI @300  01/03/2020 TTG     E. coli O157 NOT DETECTED NOT DETECTED Final   Shigella/Enteroinvasive E coli (EIEC) NOT DETECTED NOT DETECTED Final   Cryptosporidium NOT DETECTED NOT DETECTED Final   Cyclospora cayetanensis NOT DETECTED NOT DETECTED Final   Entamoeba histolytica NOT DETECTED NOT DETECTED Final   Giardia lamblia NOT DETECTED NOT DETECTED Final   Adenovirus F40/41 NOT DETECTED NOT DETECTED Final   Astrovirus NOT DETECTED NOT DETECTED Final   Norovirus GI/GII NOT DETECTED NOT DETECTED Final   Rotavirus A NOT DETECTED NOT DETECTED Final   Sapovirus (I, II, IV, and V) NOT DETECTED NOT DETECTED Final    Comment: Performed at [facility], [address] [place], [place] [address]  C Difficile Quick Screen w PCR reflex     Status: Abnormal   Collection Time: 01/03/20 12:37 AM   Specimen: Stool  Result Value Ref Range Status   C Diff antigen POSITIVE (A) NEGATIVE Final   C Diff toxin NEGATIVE NEGATIVE Final   C Diff interpretation Results are indeterminate. See PCR results.  Final    Comment: VALID Performed at [facility], [address] [place], [place] [address]   SARS Coronavirus 2 by RT PCR (hospital order, performed in [facility] hospital lab) Nasopharyngeal Nasopharyngeal Swab     Status: None   Collection Time: 01/03/20 12:37 AM   Specimen: Nasopharyngeal Swab  Result Value Ref Range Status   SARS Coronavirus 2 NEGATIVE NEGATIVE Final    Comment: (NOTE) SARS-CoV-2 target nucleic acids are NOT DETECTED.  The SARS-CoV-2 RNA is generally detectable in upper and lower respiratory specimens during the acute phase of infection. The lowest concentration of SARS-CoV-2 viral copies this assay can detect is 250 copies / mL. A negative  result does not preclude SARS-CoV-2 infection and should not be used as the sole basis for treatment or other patient management decisions.  A negative result may occur with improper specimen collection / handling, submission of specimen other than nasopharyngeal swab, presence of viral mutation(s) within the areas targeted by this assay, and inadequate number of viral copies (<250 copies / mL). A negative result must be combined with clinical observations, patient history, and epidemiological information.  Fact Sheet for Patients:   StrictlyIdeas.no  Fact Sheet for Healthcare Providers: BankingDealers.co.za  This test is not yet approved or  cleared by the [place] FDA and has been authorized for detection and/or diagnosis of SARS-CoV-2 by FDA under an Emergency Use Authorization (EUA).  This EUA will  remain in effect (meaning this test can be used) for the duration of the COVID-19 declaration under Section 564(b)(1) of the Act, 21 U.S.C. section 360bbb-3(b)(1), unless the authorization is terminated or revoked sooner.  Performed at [facility], [address] [place], [place] [address]   C. Diff by PCR, Reflexed     Status: None   Collection Time: 01/03/20 12:37 AM  Result Value Ref Range Status   Toxigenic C. Difficile by PCR NEGATIVE NEGATIVE Final    Comment: Patient is colonized with non toxigenic C. difficile. May not need treatment unless significant symptoms are present. Performed at [facility], [address] [place], [place] [address]      Labs: BNP (last 3 results) No results for input(s): BNP in the last 8760 hours. Basic Metabolic Panel: Recent Labs  Lab 01/02/20 1633 01/02/20 1902 01/03/20 0451 01/04/20 0658 01/05/20 0411  NA 136  --  135 142 144  K 3.8  --  3.5 4.1 4.3  CL 102  --  104 115* 114*  CO2 19*  --  21* 21* 24  GLUCOSE 67*  --  159* 79 105*  BUN 9  --  8 <5* <5*   CREATININE 0.69  --  0.67 0.51 0.58  CALCIUM 9.3  --  8.8* 8.1* 8.3*  MG  --  1.7  --   --   --    Liver Function Tests: Recent Labs  Lab 01/02/20 1902 01/03/20 0451 01/04/20 0658 01/05/20 0411  AST 22 20 17 19   ALT 13 11 9 9   ALKPHOS 34* 31* 27* 30*  BILITOT 1.9* 1.2 0.9 0.8  PROT 7.4 6.6 5.3* 5.6*  ALBUMIN 4.4 3.9 3.2* 3.3*   Recent Labs  Lab 01/02/20 1633  LIPASE 35   No results for input(s): AMMONIA in the last 168 hours. CBC: Recent Labs  Lab 01/02/20 1633 01/03/20 0451 01/04/20 0434 01/05/20 0411  WBC 7.7 6.2 3.6* 4.0  HGB 12.3 11.5* 9.1* 10.4*  HCT 37.5 34.7* 28.2* 31.5*  MCV 84.8 83.8 85.2 84.9  PLT 239 215 166 183   Cardiac Enzymes: No results for input(s): CKTOTAL, CKMB, CKMBINDEX, TROPONINI in the last 168 hours. BNP: Invalid input(s): POCBNP CBG: Recent Labs  Lab 01/05/20 2029 01/06/20 0032 01/06/20 0449 01/06/20 0805 01/06/20 1116  GLUCAP 99 98  98 79  79 76  76 115*   D-Dimer No results for input(s): DDIMER in the last 72 hours. Hgb A1c No results for input(s): HGBA1C in the last 72 hours. Lipid Profile No results for input(s): CHOL, HDL, LDLCALC, TRIG, CHOLHDL, LDLDIRECT in the last 72 hours. Thyroid function studies No results for input(s): TSH, T4TOTAL, T3FREE, THYROIDAB in the last 72 hours.  Invalid input(s): FREET3 Anemia work up No results for input(s): VITAMINB12, FOLATE, FERRITIN, TIBC, IRON, RETICCTPCT in the last 72 hours. Urinalysis    Component Value Date/Time   COLORURINE STRAW (A) 01/02/2020 2250   APPEARANCEUR CLEAR (A) 01/02/2020 2250   LABSPEC 1.017 01/02/2020 2250   PHURINE 5.0 01/02/2020 2250   GLUCOSEU NEGATIVE 01/02/2020 2250   HGBUR SMALL (A) 01/02/2020 2250   BILIRUBINUR NEGATIVE 01/02/2020 2250   KETONESUR 80 (A) 01/02/2020 2250   PROTEINUR NEGATIVE 01/02/2020 2250   NITRITE NEGATIVE 01/02/2020 2250   LEUKOCYTESUR NEGATIVE 01/02/2020 2250   Sepsis Labs Invalid input(s): PROCALCITONIN,  WBC,   LACTICIDVEN Microbiology Recent Results (from the past 240 hour(s))  Gastrointestinal Panel by PCR , Stool     Status: Abnormal  Collection Time: 01/03/20 12:37 AM   Specimen: Stool  Result Value Ref Range Status   Campylobacter species NOT DETECTED NOT DETECTED Final   Plesimonas shigelloides NOT DETECTED NOT DETECTED Final   Salmonella species NOT DETECTED NOT DETECTED Final   Yersinia enterocolitica NOT DETECTED NOT DETECTED Final   Vibrio species NOT DETECTED NOT DETECTED Final   Vibrio cholerae NOT DETECTED NOT DETECTED Final   Enteroaggregative E coli (EAEC) NOT DETECTED NOT DETECTED Final   Enterotoxigenic E coli (ETEC) NOT DETECTED NOT DETECTED Final   Shiga like toxin producing E coli (STEC) DETECTED (A) NOT DETECTED Final    Comment: CRITICAL RESULT CALLED TO, READ BACK BY AND VERIFIED WITH: DAVID BESANTI @300  01/03/2020 TTG     E. coli O157 NOT DETECTED NOT DETECTED Final   Shigella/Enteroinvasive E coli (EIEC) NOT DETECTED NOT DETECTED Final   Cryptosporidium NOT DETECTED NOT DETECTED Final   Cyclospora cayetanensis NOT DETECTED NOT DETECTED Final   Entamoeba histolytica NOT DETECTED NOT DETECTED Final   Giardia lamblia NOT DETECTED NOT DETECTED Final   Adenovirus F40/41 NOT DETECTED NOT DETECTED Final   Astrovirus NOT DETECTED NOT DETECTED Final   Norovirus GI/GII NOT DETECTED NOT DETECTED Final   Rotavirus A NOT DETECTED NOT DETECTED Final   Sapovirus (I, II, IV, and V) NOT DETECTED NOT DETECTED Final    Comment: Performed at [facility], [address] [place], [place] [address]  C Difficile Quick Screen w PCR reflex     Status: Abnormal   Collection Time: 01/03/20 12:37 AM   Specimen: Stool  Result Value Ref Range Status   C Diff antigen POSITIVE (A) NEGATIVE Final   C Diff toxin NEGATIVE NEGATIVE Final   C Diff interpretation Results are indeterminate. See PCR results.  Final    Comment: VALID Performed at [facility], [address] [place], [place] [address]   SARS Coronavirus 2 by RT PCR (hospital order, performed in [facility] hospital lab) Nasopharyngeal Nasopharyngeal Swab     Status: None   Collection Time: 01/03/20 12:37 AM   Specimen: Nasopharyngeal Swab  Result Value Ref Range Status   SARS Coronavirus 2 NEGATIVE NEGATIVE Final    Comment: (NOTE) SARS-CoV-2 target nucleic acids are NOT DETECTED.  The SARS-CoV-2 RNA is generally detectable in upper and lower respiratory specimens during the acute phase of infection. The lowest concentration of SARS-CoV-2 viral copies this assay can detect is 250 copies / mL. A negative result does not preclude SARS-CoV-2 infection and should not be used as the sole basis for treatment or other patient management decisions.  A negative result may occur with improper specimen collection / handling, submission of specimen other than nasopharyngeal swab, presence of viral mutation(s) within the areas targeted by this assay, and inadequate number of viral copies (<250 copies / mL). A negative result must be combined with clinical observations, patient history, and epidemiological information.  Fact Sheet for Patients:   StrictlyIdeas.no  Fact Sheet for Healthcare Providers: BankingDealers.co.za  This test is not yet approved or  cleared by the [place] FDA and has been authorized for detection and/or diagnosis of SARS-CoV-2 by FDA under an Emergency Use Authorization (EUA).  This EUA will remain in effect (meaning this test can be used) for the duration of the COVID-19 declaration under Section 564(b)(1) of the Act, 21 U.S.C. section 360bbb-3(b)(1), unless the authorization is terminated or revoked sooner.  Performed at [facility], [address], [place],  [address]  C. Diff by PCR, Reflexed     Status: None   Collection Time: 01/03/20 12:37 AM  Result Value Ref Range Status   Toxigenic C.  Difficile by PCR NEGATIVE NEGATIVE Final    Comment: Patient is colonized with non toxigenic C. difficile. May not need treatment unless significant symptoms are present. Performed at [facility], [address] [place], [place] [address]      Time coordinating discharge:  39 minutes  SIGNED:   Georgette Shell, MD  [facility] 01/06/2020, 12:29 PM

## 2020-02-27 ENCOUNTER — Other Ambulatory Visit: Payer: Medicare Other | Attending: Gastroenterology

## 2020-03-02 ENCOUNTER — Ambulatory Visit: Admission: RE | Admit: 2020-03-02 | Payer: Medicare Other | Source: Home / Self Care

## 2020-03-02 ENCOUNTER — Encounter: Admission: RE | Payer: Self-pay | Source: Home / Self Care

## 2020-03-02 SURGERY — COLONOSCOPY WITH PROPOFOL
Anesthesia: General

## 2020-04-01 ENCOUNTER — Other Ambulatory Visit
Admission: RE | Admit: 2020-04-01 | Discharge: 2020-04-01 | Disposition: A | Discharge status: Home / Self Care | Payer: Medicare Other | Source: Ambulatory Visit | Attending: Gastroenterology | Admitting: Gastroenterology

## 2020-04-01 ENCOUNTER — Other Ambulatory Visit: Payer: Self-pay

## 2020-04-01 DIAGNOSIS — Z20822 Contact with and (suspected) exposure to covid-19: Secondary | ICD-10-CM | POA: Diagnosis not present

## 2020-04-01 DIAGNOSIS — Z01812 Encounter for preprocedural laboratory examination: Secondary | ICD-10-CM | POA: Diagnosis present

## 2020-04-02 ENCOUNTER — Encounter: Payer: Self-pay | Admitting: *Deleted

## 2020-04-02 LAB — SARS CORONAVIRUS 2 (TAT 6-24 HRS): SARS Coronavirus 2: NEGATIVE

## 2020-04-05 ENCOUNTER — Ambulatory Visit: Payer: Medicare Other | Admitting: Anesthesiology

## 2020-04-05 ENCOUNTER — Encounter
Admission: RE | Disposition: A | Discharge status: Home / Self Care | Payer: Self-pay | Source: Home / Self Care | Attending: Gastroenterology

## 2020-04-05 ENCOUNTER — Ambulatory Visit
Admission: RE | Admit: 2020-04-05 | Discharge: 2020-04-05 | Disposition: A | Discharge status: Home / Self Care | Payer: Medicare Other | Attending: Gastroenterology | Admitting: Gastroenterology

## 2020-04-05 ENCOUNTER — Encounter: Payer: Self-pay | Admitting: Anesthesiology

## 2020-04-05 DIAGNOSIS — Z880 Allergy status to penicillin: Secondary | ICD-10-CM | POA: Diagnosis not present

## 2020-04-05 DIAGNOSIS — R1030 Lower abdominal pain, unspecified: Secondary | ICD-10-CM | POA: Diagnosis not present

## 2020-04-05 DIAGNOSIS — Z888 Allergy status to other drugs, medicaments and biological substances status: Secondary | ICD-10-CM | POA: Diagnosis not present

## 2020-04-05 DIAGNOSIS — Z881 Allergy status to other antibiotic agents status: Secondary | ICD-10-CM | POA: Insufficient documentation

## 2020-04-05 DIAGNOSIS — R194 Change in bowel habit: Secondary | ICD-10-CM | POA: Insufficient documentation

## 2020-04-05 DIAGNOSIS — Z885 Allergy status to narcotic agent status: Secondary | ICD-10-CM | POA: Diagnosis not present

## 2020-04-05 DIAGNOSIS — K2950 Unspecified chronic gastritis without bleeding: Secondary | ICD-10-CM | POA: Insufficient documentation

## 2020-04-05 DIAGNOSIS — R933 Abnormal findings on diagnostic imaging of other parts of digestive tract: Secondary | ICD-10-CM | POA: Diagnosis not present

## 2020-04-05 DIAGNOSIS — K219 Gastro-esophageal reflux disease without esophagitis: Secondary | ICD-10-CM | POA: Diagnosis present

## 2020-04-05 HISTORY — DX: Cortical age-related cataract, unspecified eye: H25.019

## 2020-04-05 HISTORY — PX: COLONOSCOPY WITH PROPOFOL: SHX5780

## 2020-04-05 HISTORY — DX: Age-related osteoporosis without current pathological fracture: M81.0

## 2020-04-05 HISTORY — DX: Peripheral vascular disease, unspecified: I73.9

## 2020-04-05 HISTORY — PX: ESOPHAGOGASTRODUODENOSCOPY (EGD) WITH PROPOFOL: SHX5813

## 2020-04-05 HISTORY — DX: Systemic sclerosis, unspecified: M34.9

## 2020-04-05 HISTORY — DX: Other bacterial infections of unspecified site: A49.8

## 2020-04-05 SURGERY — ESOPHAGOGASTRODUODENOSCOPY (EGD) WITH PROPOFOL
Anesthesia: General

## 2020-04-05 MED ORDER — SODIUM CHLORIDE 0.9 % IV SOLN
INTRAVENOUS | Status: DC
  Administered 2020-04-05: 20 mL/h via INTRAVENOUS

## 2020-04-05 MED ORDER — GLYCOPYRROLATE 0.2 MG/ML IJ SOLN
INTRAMUSCULAR | Status: AC
  Filled 2020-04-05: qty 1

## 2020-04-05 MED ORDER — GLYCOPYRROLATE 0.2 MG/ML IJ SOLN
INTRAMUSCULAR | Status: DC | PRN
  Administered 2020-04-05: .2 mg via INTRAVENOUS

## 2020-04-05 MED ORDER — LIDOCAINE HCL (CARDIAC) PF 100 MG/5ML IV SOSY
PREFILLED_SYRINGE | INTRAVENOUS | Status: DC | PRN
  Administered 2020-04-05: 40 mg via INTRAVENOUS

## 2020-04-05 MED ORDER — LIDOCAINE HCL (PF) 2 % IJ SOLN
INTRAMUSCULAR | Status: AC
  Filled 2020-04-05: qty 5

## 2020-04-05 MED ORDER — FENTANYL CITRATE (PF) 100 MCG/2ML IJ SOLN
INTRAMUSCULAR | Status: AC
  Filled 2020-04-05: qty 2

## 2020-04-05 MED ORDER — PROPOFOL 500 MG/50ML IV EMUL
INTRAVENOUS | Status: DC | PRN
  Administered 2020-04-05: 120 ug/kg/min via INTRAVENOUS

## 2020-04-05 MED ORDER — PROPOFOL 500 MG/50ML IV EMUL
INTRAVENOUS | Status: AC
  Filled 2020-04-05: qty 50

## 2020-04-05 MED ORDER — FENTANYL CITRATE (PF) 100 MCG/2ML IJ SOLN
INTRAMUSCULAR | Status: DC | PRN
  Administered 2020-04-05: 50 ug via INTRAVENOUS

## 2020-04-05 NOTE — Transfer of Care (Signed)
Immediate Anesthesia Transfer of Care Note  Patient: Sophia Bean  Procedure(s) Performed: ESOPHAGOGASTRODUODENOSCOPY (EGD) WITH PROPOFOL (N/A ) COLONOSCOPY WITH PROPOFOL (N/A )  Patient Location: PACU  Anesthesia Type:General  Level of Consciousness: awake and sedated  Airway & Oxygen Therapy: Patient Spontanous Breathing and Patient connected to nasal cannula oxygen  Post-op Assessment: Report given to RN and Post -op Vital signs reviewed and stable  Post vital signs: Reviewed and stable  Last Vitals:  Vitals Value Taken Time  BP    Temp    Pulse    Resp    SpO2      Last Pain:  Vitals:   04/05/20 1102  TempSrc: Temporal  PainSc: 0-No pain         Complications: No complications documented.

## 2020-04-05 NOTE — H&P (Signed)
Outpatient short stay form Pre-procedure 04/05/2020 11:24 AM Sophia Miyamoto MD, MPH  Primary Physician: Dr. Rosario Jacks  Reason for visit:  GERD/Change in bowel habits  History of present illness:   59 y/o lady with scleroderma and CREST here for EGD/Colonoscopy for change in bowel habits and lower abdominal pain. Patient with history of metastatic skin cancer to shoulder requiring multiple surgeries. Never had colonoscopy before. No blood thinners. Patient is on 2 L of 02 at baseline. No abdominal surgeries. Patient endorses constipation and not diarrhea. Take fentanyl patch and is not on a bowel regimen.    Current Facility-Administered Medications:  .  0.9 %  sodium chloride infusion, , Intravenous, Continuous, Nikolaus Pienta, Hilton Cork, MD  Medications Prior to Admission  Medication Sig Dispense Refill Last Dose  . amLODipine (NORVASC) 5 MG tablet Take 5 mg by mouth daily.   04/04/2020 at Unknown time  . blood glucose meter kit and supplies KIT Dispense based on patient and insurance preference. Use up to four times daily as directed. (FOR ICD-9 250.00, 250.01). 1 each 0 04/04/2020 at Unknown time  . Calcium Carbonate-Vitamin D (CALCIUM HIGH POTENCY/VITAMIN D) 600-200 MG-UNIT TABS Take 1 tablet by mouth 2 (two) times daily.    04/04/2020 at Unknown time  . Cholecalciferol (VITAMIN D-1000 MAX ST) 1000 units tablet Take 1,000 Units by mouth daily.    04/04/2020 at Unknown time  . famotidine (PEPCID) 40 MG tablet Take 40 mg by mouth at bedtime.   04/04/2020 at Unknown time  . fentaNYL (DURAGESIC - DOSED MCG/HR) 12 MCG/HR Place 1 patch onto the skin every other day.   0 04/04/2020 at Unknown time  . gabapentin (NEURONTIN) 100 MG capsule Take 100 mg by mouth at bedtime. Takes with a 300 mg at bedtime   04/04/2020 at Unknown time  . mycophenolate (CELLCEPT) 500 MG tablet Take 1,000 mg by mouth 2 (two) times daily.    04/04/2020 at Unknown time  . ondansetron (ZOFRAN) 4 MG tablet Take 1 tablet (4 mg  total) by mouth every 6 (six) hours as needed for nausea. 20 tablet 0 04/04/2020 at Unknown time  . pantoprazole (PROTONIX) 40 MG tablet TAKE 1 TABLET ORALLY DAILY IN THE AM 1 HOUR PRIOR TO BREAKFAST  3 04/04/2020 at Unknown time  . saccharomyces boulardii (FLORASTOR) 250 MG capsule Take 1 capsule (250 mg total) by mouth 2 (two) times daily. 60 capsule 4 04/04/2020 at Unknown time  . zoledronic acid (RECLAST) 5 MG/100ML SOLN injection Inject 5 mg into the vein once.   04/04/2020 at Unknown time  . gabapentin (NEURONTIN) 300 MG capsule Take 300 mg by mouth at bedtime. Takes with a 100 mg at bedtime  2   . nitroGLYCERIN (NITRO-BID) 2 % ointment Place onto the skin.     Marland Kitchen ondansetron (ZOFRAN) 8 MG tablet Take by mouth. (Patient not taking: Reported on 01/03/2020)        Allergies  Allergen Reactions  . Flonase [Fluticasone] Shortness Of Breath and Rash  . Minocycline     Other reaction(s): Other (See Comments) She has not been able to eat, has had severe acid reflux, has had some chest pain, and has also had breathing problems.  . Bactrim [Sulfamethoxazole-Trimethoprim]   . Morphine And Related   . Penicillins   . Duloxetine Hcl Nausea Only and Palpitations     Past Medical History:  Diagnosis Date  . Anxiety   . Arthritis   . Cancer ([place])    skin  . Cataract  cortical, senile   . Chronic respiratory failure ([place])   . COPD (chronic obstructive pulmonary disease) ([place])   . Depression   . E coli infection   . Osteoporosis, post-menopausal   . Peripheral vascular disease ([place])   . PVD (peripheral vascular disease) ([place])   . Raynaud disease   . Scleroderma ([place])   . Scoliosis   . Scoliosis     Review of systems:  Otherwise negative.    Physical Exam  Gen: Alert, oriented. Appears stated age.  HEENT: [place]/AT. PERRLA. Lungs: No respiratory distress but is on home 02 CV: RRR Abd: soft, benign, no masses. Ext: No edema.    Planned procedures: Proceed with EGD/colonoscopy.  The patient understands the nature of the planned procedure, indications, risks, alternatives and potential complications including but not limited to bleeding, infection, perforation, damage to internal organs and possible oversedation/side effects from anesthesia. The patient agrees and gives consent to proceed.  Please refer to procedure notes for findings, recommendations and patient disposition/instructions.     Sophia Miyamoto MD, MPH Gastroenterology 04/05/2020  11:24 AM

## 2020-04-05 NOTE — Op Note (Signed)
[facility] Gastroenterology Patient Name: Sophia Bean Procedure Date: 04/05/2020 11:33 AM MRN: [id number] Account #: [id number] Date of Birth: 1961-03-21 Admit Type: Outpatient Age: 59 Room: [facility] ENDO ROOM 3 Gender: Female Note Status: Finalized Procedure:             Upper GI endoscopy Indications:           Epigastric abdominal pain, Gastro-esophageal reflux                         disease Providers:             Andrey Farmer MD, MD Referring MD:          Casilda Carls, MD (Referring MD) Medicines:             Monitored Anesthesia Care Complications:         No immediate complications. Estimated blood loss:                         Minimal. Procedure:             Pre-Anesthesia Assessment:                        - Prior to the procedure, a History and Physical was                         performed, and patient medications and allergies were                         reviewed. The patient is competent. The risks and                         benefits of the procedure and the sedation options and                         risks were discussed with the patient. All questions                         were answered and informed consent was obtained.                         Patient identification and proposed procedure were                         verified by the physician, the nurse, the anesthetist                         and the technician in the endoscopy suite. Mental                         Status Examination: alert and oriented. Airway                         Examination: normal oropharyngeal airway and neck                         mobility. Respiratory Examination: clear to                         auscultation. CV  Examination: normal. Prophylactic                         Antibiotics: The patient does not require prophylactic                         antibiotics. Prior Anticoagulants: The patient has                         taken no previous anticoagulant or  antiplatelet                         agents. ASA Grade Assessment: III - A patient with                         severe systemic disease. After reviewing the risks and                         benefits, the patient was deemed in satisfactory                         condition to undergo the procedure. The anesthesia                         plan was to use monitored anesthesia care (MAC).                         Immediately prior to administration of medications,                         the patient was re-assessed for adequacy to receive                         sedatives. The heart rate, respiratory rate, oxygen                         saturations, blood pressure, adequacy of pulmonary                         ventilation, and response to care were monitored                         throughout the procedure. The physical status of the                         patient was re-assessed after the procedure.                        After obtaining informed consent, the endoscope was                         passed under direct vision. Throughout the procedure,                         the patient's blood pressure, pulse, and oxygen                         saturations were monitored continuously. The Endoscope  was introduced through the mouth, and advanced to the                         second part of duodenum. The upper GI endoscopy was                         accomplished without difficulty. The patient tolerated                         the procedure well. Findings:      The examined esophagus was normal.      The entire examined stomach was normal. Biopsies were taken with a cold       forceps for Helicobacter pylori testing. Estimated blood loss was       minimal.      The examined duodenum was normal. Impression:            - Normal esophagus.                        - Normal stomach. Biopsied.                        - Normal examined duodenum. Recommendation:        -  Discharge patient to home.                        - Resume previous diet.                        - Continue present medications.                        - Return to referring physician as previously                         scheduled.                        - Await pathology results. Procedure Code(s):     --- Professional ---                        [phone], Esophagogastroduodenoscopy, flexible,                         transoral; with biopsy, single or multiple Diagnosis Code(s):     --- Professional ---                        R10.13, Epigastric pain                        K21.9, Gastro-esophageal reflux disease without                         esophagitis CPT copyright 2019 American Medical Association. All rights reserved. The codes documented in this report are preliminary and upon coder review may  be revised to meet current compliance requirements. Andrey Farmer, MD Andrey Farmer MD, MD 04/05/2020 12:12:38 PM Number of Addenda: 0 Note Initiated On: 04/05/2020 11:33 AM Estimated Blood Loss:  Estimated blood loss was minimal.      [facility]

## 2020-04-05 NOTE — Anesthesia Preprocedure Evaluation (Signed)
Anesthesia Evaluation  Patient identified by MRN, date of birth, ID band Patient awake    Reviewed: Allergy & Precautions, NPO status , Patient's Chart, lab work & pertinent test results  History of Anesthesia Complications Negative for: history of anesthetic complications  Airway Mallampati: II  TM Distance: >3 FB Neck ROM: Full    Dental  (+) Poor Dentition   Pulmonary neg sleep apnea,  Scleroderma, on home O2   breath sounds clear to auscultation- rhonchi (-) wheezing      Cardiovascular (-) hypertension(-) CAD, (-) Past MI, (-) Cardiac Stents and (-) CABG  Rhythm:Regular Rate:Normal - Systolic murmurs and - Diastolic murmurs    Neuro/Psych neg Seizures PSYCHIATRIC DISORDERS Anxiety Depression negative neurological ROS     GI/Hepatic negative GI ROS, Neg liver ROS,   Endo/Other  negative endocrine ROSneg diabetes  Renal/GU negative Renal ROS     Musculoskeletal  (+) Arthritis ,   Abdominal (+) - obese,   Peds  Hematology negative hematology ROS (+)   Anesthesia Other Findings Past Medical History: No date: Anxiety No date: Arthritis No date: Cancer ([place])     Comment:  skin No date: Cataract cortical, senile No date: Chronic respiratory failure (HCC) No date: COPD (chronic obstructive pulmonary disease) (HCC) No date: Depression No date: E coli infection No date: Osteoporosis, post-menopausal No date: Peripheral vascular disease (HCC) No date: PVD (peripheral vascular disease) (HCC) No date: Raynaud disease No date: Scleroderma ([place]) No date: Scoliosis No date: Scoliosis   Reproductive/Obstetrics                             Anesthesia Physical Anesthesia Plan  ASA: IV  Anesthesia Plan: General   Post-op Pain Management:    Induction: Intravenous  PONV Risk Score and Plan: 2 and Propofol infusion  Airway Management Planned: Natural Airway  Additional Equipment:    Intra-op Plan:   Post-operative Plan:   Informed Consent: I have reviewed the patients History and Physical, chart, labs and discussed the procedure including the risks, benefits and alternatives for the proposed anesthesia with the patient or authorized representative who has indicated his/her understanding and acceptance.     Dental advisory given  Plan Discussed with: CRNA and Anesthesiologist  Anesthesia Plan Comments:         Anesthesia Quick Evaluation

## 2020-04-05 NOTE — Anesthesia Postprocedure Evaluation (Signed)
Anesthesia Post Note  Patient: Sophia Bean  Procedure(s) Performed: ESOPHAGOGASTRODUODENOSCOPY (EGD) WITH PROPOFOL (N/A ) COLONOSCOPY WITH PROPOFOL (N/A )  Patient location during evaluation: Endoscopy Anesthesia Type: General Level of consciousness: awake and alert and oriented Pain management: pain level controlled Vital Signs Assessment: post-procedure vital signs reviewed and stable Respiratory status: spontaneous breathing, nonlabored ventilation and respiratory function stable Cardiovascular status: blood pressure returned to baseline and stable Postop Assessment: no signs of nausea or vomiting Anesthetic complications: no   No complications documented.   Last Vitals:  Vitals:   04/05/20 1234 04/05/20 1244  BP: (!) 149/64 (!) 156/61  Pulse: 81 77  Resp: 16 17  Temp:    SpO2: 100% 100%    Last Pain:  Vitals:   04/05/20 1244  TempSrc:   PainSc: 0-No pain                 Lenvil Swaim

## 2020-04-05 NOTE — Anesthesia Procedure Notes (Signed)
Performed by: Cook-Martin, Samanthan Dugo Pre-anesthesia Checklist: Patient identified, Emergency Drugs available, Suction available, Patient being monitored and Timeout performed Patient Re-evaluated:Patient Re-evaluated prior to induction Oxygen Delivery Method: Nasal cannula Preoxygenation: Pre-oxygenation with 100% oxygen Induction Type: IV induction Airway Equipment and Method: Bite block Placement Confirmation: positive ETCO2 and CO2 detector       

## 2020-04-05 NOTE — Interval H&P Note (Signed)
History and Physical Interval Note:  04/05/2020 11:28 AM  Sophia Bean  has presented today for surgery, with the diagnosis of GERD  EPIG PAIN LOWER ABD PAIN ABNORMAL CT.  The various methods of treatment have been discussed with the patient and family. After consideration of risks, benefits and other options for treatment, the patient has consented to  Procedure(s): ESOPHAGOGASTRODUODENOSCOPY (EGD) WITH PROPOFOL (N/A) COLONOSCOPY WITH PROPOFOL (N/A) as a surgical intervention.  The patient's history has been reviewed, patient examined, no change in status, stable for surgery.  I have reviewed the patient's chart and labs.  Questions were answered to the patient's satisfaction.     Lesly Rubenstein  Ok to proceed with EGD/Colonoscopy

## 2020-04-05 NOTE — Op Note (Signed)
[facility] Gastroenterology Patient Name: Sophia Bean Procedure Date: 04/05/2020 11:32 AM MRN: [id number] Account #: [id number] Date of Birth: 09/14/1960 Admit Type: Outpatient Age: 59 Room: [facility] ENDO ROOM 3 Gender: Female Note Status: Finalized Procedure:             Colonoscopy Indications:           Lower abdominal pain, Abnormal CT of the GI tract,                         Change in bowel habits Providers:             Andrey Farmer MD, MD Referring MD:          Casilda Carls, MD (Referring MD) Medicines:             Monitored Anesthesia Care Complications:         No immediate complications. Procedure:             Pre-Anesthesia Assessment:                        - Prior to the procedure, a History and Physical was                         performed, and patient medications and allergies were                         reviewed. The patient is competent. The risks and                         benefits of the procedure and the sedation options and                         risks were discussed with the patient. All questions                         were answered and informed consent was obtained.                         Patient identification and proposed procedure were                         verified by the physician, the nurse, the anesthetist                         and the technician in the endoscopy suite. Mental                         Status Examination: alert and oriented. Airway                         Examination: normal oropharyngeal airway and neck                         mobility. Respiratory Examination: clear to                         auscultation. CV Examination: normal. Prophylactic  Antibiotics: The patient does not require prophylactic                         antibiotics. Prior Anticoagulants: The patient has                         taken no previous anticoagulant or antiplatelet                         agents. ASA Grade  Assessment: III - A patient with                         severe systemic disease. After reviewing the risks and                         benefits, the patient was deemed in satisfactory                         condition to undergo the procedure. The anesthesia                         plan was to use monitored anesthesia care (MAC).                         Immediately prior to administration of medications,                         the patient was re-assessed for adequacy to receive                         sedatives. The heart rate, respiratory rate, oxygen                         saturations, blood pressure, adequacy of pulmonary                         ventilation, and response to care were monitored                         throughout the procedure. The physical status of the                         patient was re-assessed after the procedure.                        After obtaining informed consent, the colonoscope was                         passed under direct vision. Throughout the procedure,                         the patient's blood pressure, pulse, and oxygen                         saturations were monitored continuously. The was                         introduced through the anus and advanced to the the  terminal ileum. The colonoscopy was performed without                         difficulty. The patient tolerated the procedure well.                         The quality of the bowel preparation was good. Findings:      The perianal and digital rectal examinations were normal.      The terminal ileum appeared normal.      The colon (entire examined portion) appeared normal.      The exam was otherwise without abnormality on direct and retroflexion       views. Impression:            - The examined portion of the ileum was normal.                        - The entire examined colon is normal.                        - The examination was otherwise normal on  direct and                         retroflexion views.                        - No specimens collected. Recommendation:        - Discharge patient to home.                        - Resume previous diet.                        - Continue present medications.                        - Repeat colonoscopy in 10 years for screening                         purposes.                        - Return to referring physician as previously                         scheduled. Procedure Code(s):     --- Professional ---                        [phone], Colonoscopy, flexible; diagnostic, including                         collection of specimen(s) by brushing or washing, when                         performed (separate procedure) Diagnosis Code(s):     --- Professional ---                        R10.30, Lower abdominal pain, unspecified                        R19.4,  Change in bowel habit                        R93.3, Abnormal findings on diagnostic imaging of                         other parts of digestive tract CPT copyright 2019 American Medical Association. All rights reserved. The codes documented in this report are preliminary and upon coder review may  be revised to meet current compliance requirements. Andrey Farmer, MD Andrey Farmer MD, MD 04/05/2020 12:15:14 PM Number of Addenda: 0 Note Initiated On: 04/05/2020 11:32 AM Scope Withdrawal Time: 0 hours 6 minutes 47 seconds  Total Procedure Duration: 0 hours 20 minutes 56 seconds  Estimated Blood Loss:  Estimated blood loss: none.      [facility]

## 2020-04-06 ENCOUNTER — Encounter: Payer: Self-pay | Admitting: Gastroenterology

## 2020-04-06 LAB — SURGICAL PATHOLOGY

## 2020-07-01 ENCOUNTER — Other Ambulatory Visit: Payer: Self-pay | Admitting: Anesthesiology

## 2020-07-01 DIAGNOSIS — M545 Low back pain, unspecified: Secondary | ICD-10-CM

## 2020-07-01 DIAGNOSIS — M25512 Pain in left shoulder: Secondary | ICD-10-CM

## 2020-07-12 ENCOUNTER — Ambulatory Visit
Admission: RE | Admit: 2020-07-12 | Discharge: 2020-07-12 | Disposition: A | Discharge status: Home / Self Care | Payer: Medicare Other | Source: Ambulatory Visit | Attending: Anesthesiology | Admitting: Anesthesiology

## 2020-07-12 ENCOUNTER — Other Ambulatory Visit: Payer: Self-pay

## 2020-07-12 DIAGNOSIS — M545 Low back pain, unspecified: Secondary | ICD-10-CM | POA: Diagnosis present

## 2020-07-12 DIAGNOSIS — M25512 Pain in left shoulder: Secondary | ICD-10-CM | POA: Insufficient documentation

## 2020-08-13 ENCOUNTER — Other Ambulatory Visit: Payer: Self-pay

## 2020-08-13 ENCOUNTER — Emergency Department
Admission: EM | Admit: 2020-08-13 | Discharge: 2020-08-13 | Disposition: A | Discharge status: Home / Self Care | Payer: Medicare Other | Attending: Emergency Medicine | Admitting: Emergency Medicine

## 2020-08-13 ENCOUNTER — Emergency Department: Payer: Medicare Other

## 2020-08-13 ENCOUNTER — Telehealth (INDEPENDENT_AMBULATORY_CARE_PROVIDER_SITE_OTHER): Payer: Self-pay | Admitting: Vascular Surgery

## 2020-08-13 DIAGNOSIS — Z85828 Personal history of other malignant neoplasm of skin: Secondary | ICD-10-CM | POA: Insufficient documentation

## 2020-08-13 DIAGNOSIS — J449 Chronic obstructive pulmonary disease, unspecified: Secondary | ICD-10-CM | POA: Diagnosis not present

## 2020-08-13 DIAGNOSIS — M79662 Pain in left lower leg: Secondary | ICD-10-CM | POA: Diagnosis present

## 2020-08-13 DIAGNOSIS — I82812 Embolism and thrombosis of superficial veins of left lower extremities: Secondary | ICD-10-CM | POA: Insufficient documentation

## 2020-08-13 LAB — COMPREHENSIVE METABOLIC PANEL
ALT: 11 U/L (ref 0–44)
AST: 20 U/L (ref 15–41)
Albumin: 3.8 g/dL (ref 3.5–5.0)
Alkaline Phosphatase: 44 U/L (ref 38–126)
Anion gap: 5 (ref 5–15)
BUN: 10 mg/dL (ref 6–20)
CO2: 26 mmol/L (ref 22–32)
Calcium: 9.3 mg/dL (ref 8.9–10.3)
Chloride: 105 mmol/L (ref 98–111)
Creatinine, Ser: 0.74 mg/dL (ref 0.44–1.00)
GFR, Estimated: >60 mL/min (ref >60)
Glucose, Bld: 126 mg/dL — ABNORMAL HIGH (ref 70–99)
Potassium: 3.6 mmol/L (ref 3.5–5.1)
Sodium: 136 mmol/L (ref 135–145)
Total Bilirubin: 0.9 mg/dL (ref 0.3–1.2)
Total Protein: 7 g/dL (ref 6.5–8.1)

## 2020-08-13 LAB — CBC WITH DIFFERENTIAL/PLATELET
Abs Immature Granulocytes: 0.04 10*3/uL (ref 0.00–0.07)
Basophils Absolute: 0.1 10*3/uL (ref 0.0–0.1)
Basophils Relative: 1 %
Eosinophils Absolute: 0.1 10*3/uL (ref 0.0–0.5)
Eosinophils Relative: 2 %
HCT: 31.6 % — ABNORMAL LOW (ref 36.0–46.0)
Hemoglobin: 9.9 g/dL — ABNORMAL LOW (ref 12.0–15.0)
Immature Granulocytes: 1 %
Lymphocytes Relative: 27 %
Lymphs Abs: 1.3 10*3/uL (ref 0.7–4.0)
MCH: 26.5 pg (ref 26.0–34.0)
MCHC: 31.3 g/dL (ref 30.0–36.0)
MCV: 84.5 fL (ref 80.0–100.0)
Monocytes Absolute: 0.5 10*3/uL (ref 0.1–1.0)
Monocytes Relative: 10 %
Neutro Abs: 2.8 10*3/uL (ref 1.7–7.7)
Neutrophils Relative %: 59 %
Platelets: 219 10*3/uL (ref 150–400)
RBC: 3.74 MIL/uL — ABNORMAL LOW (ref 3.87–5.11)
RDW: 14.1 % (ref 11.5–15.5)
WBC: 4.7 10*3/uL (ref 4.0–10.5)
nRBC: 0 % (ref 0.0–0.2)

## 2020-08-13 LAB — APTT: aPTT: 35 seconds (ref 24–36)

## 2020-08-13 MED ORDER — APIXABAN 5 MG PO TABS: ORAL_TABLET | ORAL | 0 refills | Status: AC

## 2020-08-13 NOTE — ED Provider Notes (Signed)
[facility]-EMERGENCY DEPARTMENT  ____________________________________________  Time seen: Approximately 6:28 PM  I have reviewed the triage vital signs and the nursing notes.   HISTORY  Chief Complaint Abnormal Lab   Historian Patient     HPI Sophia Bean is a 60 y.o. female presents to the emergency department referred from [facility] dermatology with concern for DVT.  Patient has had a skin cancer removed from her left anterior shin.  Patient was placed on doxycycline by her dermatologist with concern for some erythema that developed along the lateral aspect of the calf.  Patient states that calf pain is worsened and is more erythematous than it has been in the past.  She does have a history of venous stasis.  Patient denies prior DVT.  No new shortness of breath or chest tightness.   Past Medical History:  Diagnosis Date  . Anxiety   . Arthritis   . Cancer ([place])    skin  . Cataract cortical, senile   . Chronic respiratory failure ([place])   . COPD (chronic obstructive pulmonary disease) ([place])   . Depression   . E coli infection   . Osteoporosis, post-menopausal   . Peripheral vascular disease ([place])   . PVD (peripheral vascular disease) ([place])   . Raynaud disease   . Scleroderma ([place])   . Scoliosis   . Scoliosis      Immunizations up to date:  Yes.     Past Medical History:  Diagnosis Date  . Anxiety   . Arthritis   . Cancer ([place])    skin  . Cataract cortical, senile   . Chronic respiratory failure ([place])   . COPD (chronic obstructive pulmonary disease) ([place])   . Depression   . E coli infection   . Osteoporosis, post-menopausal   . Peripheral vascular disease ([place])   . PVD (peripheral vascular disease) ([place])   . Raynaud disease   . Scleroderma ([place])   . Scoliosis   . Scoliosis     Patient Active Problem List   Diagnosis Date Noted  . Diarrhea 01/03/2020  . Hypoglycemia 01/03/2020  . Chronic respiratory failure with hypoxia ([place])   . Chronic pain   .  Scleroderma ([place])   . Interstitial lung disease ([place])   . Lymphedema 09/23/2017  . Chronic venous insufficiency 09/23/2017  . Varicose veins of both lower extremities with pain 07/09/2017  . Venous stasis ulcer of ankle with varicose veins ([place]) 07/09/2017  . Bilateral lower extremity edema 07/09/2017    Past Surgical History:  Procedure Laterality Date  . COLONOSCOPY WITH PROPOFOL N/A 04/05/2020   Procedure: COLONOSCOPY WITH PROPOFOL;  Surgeon: Lesly Rubenstein, MD;  Location: [facility] ENDOSCOPY;  Service: Endoscopy;  Laterality: N/A;  . ESOPHAGOGASTRODUODENOSCOPY (EGD) WITH PROPOFOL N/A 04/05/2020   Procedure: ESOPHAGOGASTRODUODENOSCOPY (EGD) WITH PROPOFOL;  Surgeon: Lesly Rubenstein, MD;  Location: [facility] ENDOSCOPY;  Service: Endoscopy;  Laterality: N/A;  . flap conjunctival    . mohs Right   . SHOULDER ARTHROSCOPY     times 9  . toe nail removal    . TOOTH EXTRACTION      Prior to Admission medications   Medication Sig Start Date End Date Taking? Authorizing Provider  apixaban (ELIQUIS) 5 MG TABS tablet Take 2 tablets in the morning and 2 tablets at night for the first seven days. After the first week, take 1 tablet in the morning and 1 tablet at night. 08/13/20  Yes Vallarie Mare M, PA-C  amLODipine (NORVASC) 5 MG tablet Take 5 mg  by mouth daily.    [provider]  blood glucose meter kit and supplies KIT Dispense based on patient and insurance preference. Use up to four times daily as directed. (FOR ICD-9 250.00, 250.01). 01/06/20   Georgette Shell, MD  Calcium Carbonate-Vitamin D (CALCIUM HIGH POTENCY/VITAMIN D) 600-200 MG-UNIT TABS Take 1 tablet by mouth 2 (two) times daily.     [provider]  Cholecalciferol (VITAMIN D-1000 MAX ST) 1000 units tablet Take 1,000 Units by mouth daily.     [provider]  famotidine (PEPCID) 40 MG tablet Take 40 mg by mouth at bedtime.    [provider]  fentaNYL (DURAGESIC - DOSED MCG/HR) 12 MCG/HR Place 1  patch onto the skin every other day.  09/02/17   [provider]  gabapentin (NEURONTIN) 100 MG capsule Take 100 mg by mouth at bedtime. Takes with a 300 mg at bedtime 12/24/19   [provider]  gabapentin (NEURONTIN) 300 MG capsule Take 300 mg by mouth at bedtime. Takes with a 100 mg at bedtime 05/12/17   [provider]  mycophenolate (CELLCEPT) 500 MG tablet Take 1,000 mg by mouth 2 (two) times daily.  01/02/17   [provider]  nitroGLYCERIN (NITRO-BID) 2 % ointment Place onto the skin. 02/06/17 02/06/18  [provider]  ondansetron (ZOFRAN) 4 MG tablet Take 1 tablet (4 mg total) by mouth every 6 (six) hours as needed for nausea. 01/06/20   Georgette Shell, MD  ondansetron (ZOFRAN) 8 MG tablet Take by mouth. Patient not taking: Reported on 01/03/2020 07/19/17   [provider]  pantoprazole (PROTONIX) 40 MG tablet TAKE 1 TABLET ORALLY DAILY IN THE AM 1 HOUR PRIOR TO BREAKFAST 09/13/17   [provider]  saccharomyces boulardii (FLORASTOR) 250 MG capsule Take 1 capsule (250 mg total) by mouth 2 (two) times daily. 01/06/20   Georgette Shell, MD  zoledronic acid (RECLAST) 5 MG/100ML SOLN injection Inject 5 mg into the vein once.    [provider]    Allergies Flonase [fluticasone], Minocycline, Bactrim [sulfamethoxazole-trimethoprim], Morphine and related, Penicillins, and Duloxetine hcl  Family History  Problem Relation Age of Onset  . Heart disease Mother   . Hypertension Mother   . Cancer Mother   . Cancer Father   . Cancer Maternal Grandmother   . Heart disease Maternal Grandmother   . Breast cancer Neg Hx     Social History Social History   Tobacco Use  . Smoking status: Never Smoker  . Smokeless tobacco: Never Used  Substance Use Topics  . Alcohol use: No    Alcohol/week: 0.0 standard drinks     Review of Systems  Constitutional: No fever/chills Eyes:  No discharge ENT: No upper respiratory  complaints. Respiratory: no cough. No SOB/ use of accessory muscles to breath Gastrointestinal:   No nausea, no vomiting.  No diarrhea.  No constipation. Musculoskeletal: Negative for musculoskeletal pain. Skin: Patient has erythema of left calf.     ____________________________________________   PHYSICAL EXAM:  VITAL SIGNS: ED Triage Vitals  Enc Vitals Group     BP 08/13/20 1801 131/66     Pulse Rate 08/13/20 1801 82     Resp 08/13/20 1801 16     Temp 08/13/20 1801 98.4 F (36.9 C)     Temp Source 08/13/20 1801 Oral     SpO2 08/13/20 1801 98 %     Weight 08/13/20 1802 120 lb (54.4 kg)     Height 08/13/20  1802 _0  (1.702 m)     Head Circumference --      Peak Flow --      Pain Score 08/13/20 1802 4     Pain Loc --      Pain Edu? --      Excl. in [place]? --      Constitutional: Alert and oriented. Well appearing and in no acute distress. Eyes: Conjunctivae are normal. PERRL. EOMI. Head: Atraumatic. ENT:      Nose: No congestion/rhinnorhea.      Mouth/Throat: Mucous membranes are moist.  Neck: No stridor.  No cervical spine tenderness to palpation. Cardiovascular: Normal rate, regular rhythm. Normal S1 and S2.  Good peripheral circulation. Respiratory: Normal respiratory effort without tachypnea or retractions. Lungs CTAB. Good air entry to the bases with no decreased or absent breath sounds Gastrointestinal: Bowel sounds x 4 quadrants. Soft and nontender to palpation. No guarding or rigidity. No distention. Musculoskeletal: Full range of motion to all extremities. No obvious deformities noted. Patient has left calf pain to palpation.  Neurologic:  Normal for age. No gross focal neurologic deficits are appreciated.  Skin: Patient has erythema of the left calf.  Psychiatric: Mood and affect are normal for age. Speech and behavior are normal.   ____________________________________________   LABS (all labs ordered are listed, but only abnormal results are  displayed)  Labs Reviewed  CBC WITH DIFFERENTIAL/PLATELET - Abnormal; Notable for the following components:      Result Value   RBC 3.74 (*)    Hemoglobin 9.9 (*)    HCT 31.6 (*)    All other components within normal limits  COMPREHENSIVE METABOLIC PANEL - Abnormal; Notable for the following components:   Glucose, Bld 126 (*)    All other components within normal limits  APTT   ____________________________________________  EKG   ____________________________________________  RADIOLOGY {I, Lannie Fields, personally viewed and evaluated these images (plain radiographs) as part of my medical decision making, as well as reviewing the written report by the radiologist.    US Venous Img Lower Unilateral Left  Result Date: 08/13/2020 CLINICAL DATA:  Left leg pain, swelling, and redness. EXAM: LEFT LOWER EXTREMITY VENOUS DOPPLER ULTRASOUND TECHNIQUE: Gray-scale sonography with graded compression, as well as color Doppler and duplex ultrasound were performed to evaluate the lower extremity deep venous systems from the level of the common femoral vein and including the common femoral, femoral, profunda femoral, popliteal and calf veins including the posterior tibial, peroneal and gastrocnemius veins when visible. The superficial great saphenous vein was also interrogated. Spectral Doppler was utilized to evaluate flow at rest and with distal augmentation maneuvers in the common femoral, femoral and popliteal veins. COMPARISON:  None. FINDINGS: Contralateral Common Femoral Vein: Respiratory phasicity is normal and symmetric with the symptomatic side. No evidence of thrombus. Normal compressibility. Common Femoral Vein: No evidence of thrombus. Normal compressibility, respiratory phasicity and response to augmentation. Saphenofemoral Junction: No evidence of thrombus. Normal compressibility and flow on color Doppler imaging. Profunda Femoral Vein: No evidence of thrombus. Normal compressibility and  flow on color Doppler imaging. Femoral Vein: No evidence of thrombus. Normal compressibility, respiratory phasicity and response to augmentation. Popliteal Vein: No evidence of thrombus. Normal compressibility, respiratory phasicity and response to augmentation. Calf Veins: No evidence of thrombus. Normal compressibility and flow on color Doppler imaging. Superficial Great Saphenous Vein: Positive for expansile thrombus involving the GSV and its branches in the calf Venous Reflux:  None. Other Findings:  None. IMPRESSION: 1. Superficial  venous thrombosis of the left GSV and its branches in the calf. 2. No evidence of deep venous thrombosis. Electronically Signed   By: Logan Bores M.D.   On: 08/13/2020 20:13    ____________________________________________    PROCEDURES  Procedure(s) performed:     Procedures     Medications - No data to display   ____________________________________________   INITIAL IMPRESSION / ASSESSMENT AND PLAN / ED COURSE  Pertinent labs & imaging results that were available during my care of the patient were reviewed by me and considered in my medical decision making (see chart for details).       Assessment and Plan:  Superficial venous thrombosis 60 year old female presents to the emergency department with left calf pain and erythema.  Vital signs are reassuring at triage.  On physical exam, patient had tenderness to palpation of the left calf and had erythema.  CBC and CMP were reassuring.  Venous ultrasound was concerning for expansile clot formation in the great saphenous vein.  I discussed the pros and cons of anticoagulation with patient and she elected to start Eliquis.  Patient education regarding dosing was provided in patient's discharge paperwork.  Strict return precautions were given to return with worsening shortness of breath, chest tightness or chest pain.  Plan of care was discussed with Dr. Ellender Hose who agrees with management plan at this  time.   ____________________________________________  FINAL CLINICAL IMPRESSION(S) / ED DIAGNOSES  Final diagnoses:  Acute superficial venous thrombosis of left lower extremity      NEW MEDICATIONS STARTED DURING THIS VISIT:  ED Discharge Orders         Ordered    apixaban (ELIQUIS) 5 MG TABS tablet        08/13/20 2058              This chart was dictated using voice recognition software/Dragon. Despite best efforts to proofread, errors can occur which can change the meaning. Any change was purely unintentional.     Karren Cobble 08/13/20 2107    Arta Silence, MD 08/13/20 2259

## 2020-08-13 NOTE — Telephone Encounter (Signed)
pt states that dermatologist thinks she has a blood clot in her leg. recent COVID - tested positive 07/28/20 - leg swelling - hard place in leg- had streak going up leg -dermatologist placed her on antibiotics - states streak is red and swollen, painful - goes from ankle up about knee -

## 2020-08-13 NOTE — Telephone Encounter (Signed)
This pt has not been seen here in almost three years she will need to come in with a DVT U/S and office visit

## 2020-08-13 NOTE — ED Notes (Signed)
Patient assisted with home oxygen and wheelchair to POV.

## 2020-08-13 NOTE — Discharge Instructions (Signed)
Take 2 tablets of Eliquis in the morning and 2 tablets at night for the first week. And take 1 tablet of Eliquis in the morning and 1 tablet at night.

## 2020-08-13 NOTE — Telephone Encounter (Signed)
It sounds like a superficial thrombophlebitis.  Generally these arent dangerous but they are painful.  I'd recommend the patient start a daily baby aspirin until we can get her in for the dvt study and office visit

## 2020-08-13 NOTE — ED Notes (Signed)
Patient provided with discharge instructions and prescription information, including how to take Eliquis. Patient verbalized understanding. No needs expressed at this time.

## 2020-08-13 NOTE — ED Notes (Signed)
Pt states coming in because she thinks she has a blood clot to the left leg. Pt states pain and swelling to the area. Pt also states she had covid 3 weeks ago and has a significant history of cancer with cancer to the left leg. Pt on 2lpm of oxygen. Pt states it is constant and she uses it at home as well. Pt states leg is tender to the touch. Left DP pulse, +1 on palpation

## 2020-08-13 NOTE — ED Triage Notes (Signed)
Pt here via POV after referral from [facility] dermatology for possible thrombophlebitis on the left leg.   Pt with hx of CREST syndrome, takes immunosuppressants and had recent covid.   Pt on 2L chronic [place].

## 2020-08-16 NOTE — TOC Progression Note (Signed)
Transition of Care [facility]) - Progression Note    Patient Details  Name: Sophia Bean MRN: [id number] Date of Birth: [birth year]  Transition of Care [facility]) CM/SW Contact  Anselm Pancoast, RN Phone Number: 08/16/2020, 2:34 PM  Clinical Narrative:    Received call patient was at pharmacy and could not afford copay for Eliquis. Writer provided coupon code and confirmed with pharmacist.   Updated patient prescription was ready for pickup and had zero copay for 72 pills. Patient has follow up with PCP before that time and states she will ask about obtaining the last 8 pills in possible samples.         Expected Discharge Plan and Services                                                 Social Determinants of Health (SDOH) Interventions    Readmission Risk Interventions No flowsheet data found.

## 2020-08-16 NOTE — Telephone Encounter (Signed)
Please not that you returned this pt's call

## 2020-08-17 NOTE — Telephone Encounter (Signed)
Spoke with pt - she ended up at the ED and does have a clot. States that they put her on Equilis and told her to F/U with her PCP to manage meds/clot. I offered an appt for tomorrow with U/S and see FB right after and pt declined. Stated that she had an U/S in the hospital so there  Was no need for another one. I advised her to F/U with [place] when she wanted to and keep in mind that she will become a "new" pt after 4.15.22. Pt acknowledged and nothing further needed at this time.

## 2020-09-20 ENCOUNTER — Ambulatory Visit (INDEPENDENT_AMBULATORY_CARE_PROVIDER_SITE_OTHER): Payer: Medicare Other | Admitting: Primary Care

## 2021-08-03 IMAGING — MR MR LUMBAR SPINE W/O CM
5 series · 31 of 48 positions shown · non-contrast
Comparison: 09/13/2017 and prior.

CLINICAL DATA: Chronic lower back pain.

EXAM:
MRI LUMBAR SPINE WITHOUT CONTRAST
TECHNIQUE: Multiplanar, multisequence MR imaging of the lumbar spine was
performed. No intravenous contrast was administered.

[Series 5: T2 · sagittal · 4.0mm · 0.81mm/px · 6 of 17 slices shown (1 of 2)]
[im 1/17]
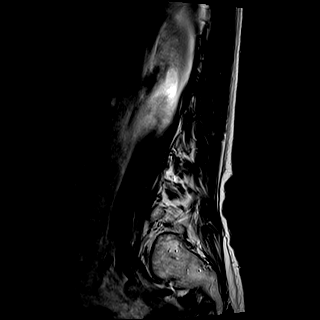
[im 4/17]
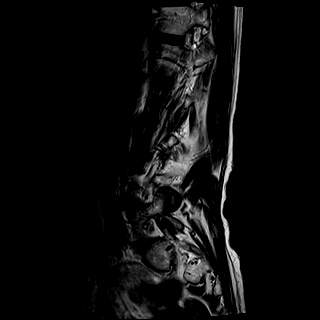
[im 7/17]
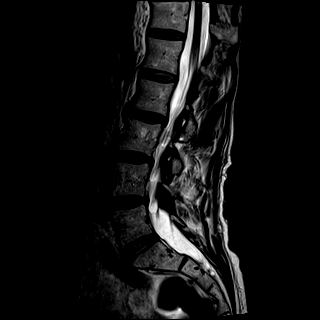
[im 10/17]
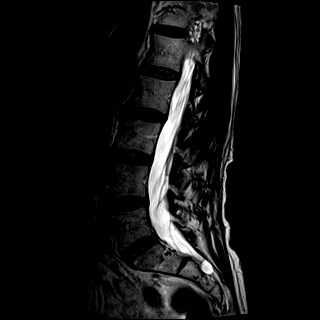
[im 13/17]
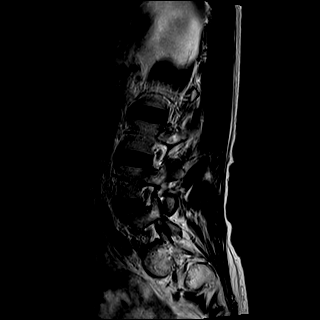
[im 17/17]
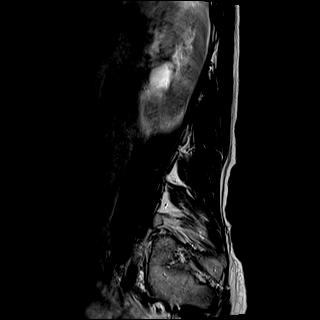

[Series 6: T1 · sagittal · 4.0mm · 0.81mm/px · 7 of 17 slices shown (1 of 2)]
[im 1/17]
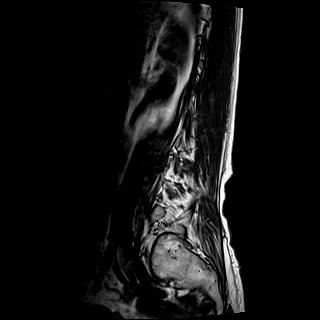
[im 3/17]
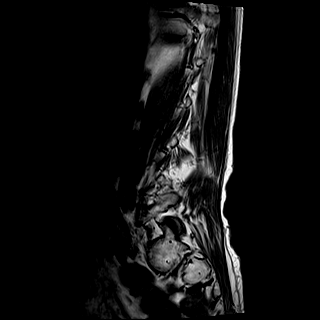
[im 6/17]
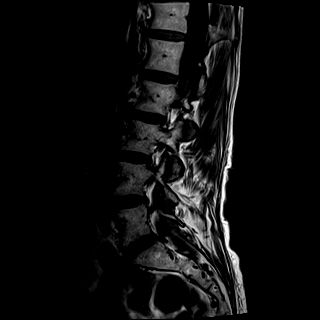
[im 9/17]
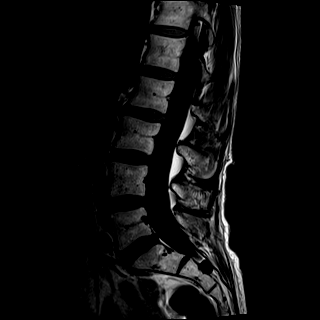
[im 11/17]
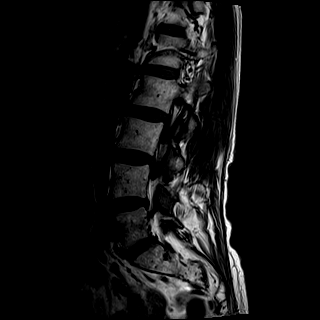
[im 14/17]
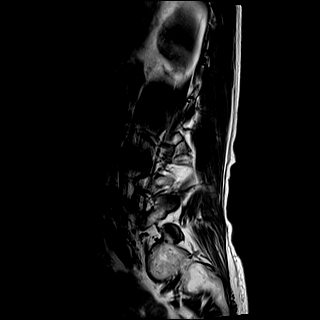
[im 17/17]
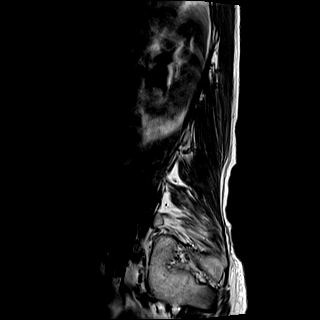

[Series 7: STIR · sagittal · 4.0mm · 0.41mm/px · 2 of 17 slices shown]
[im 1/17]
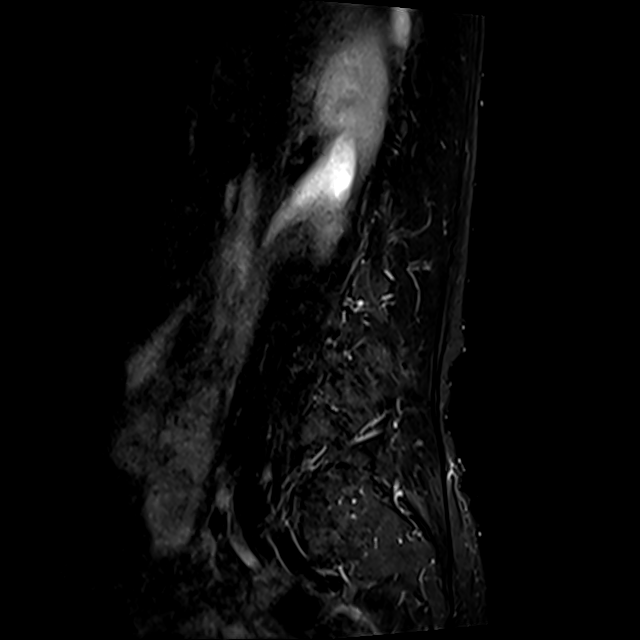
[im 3/17]
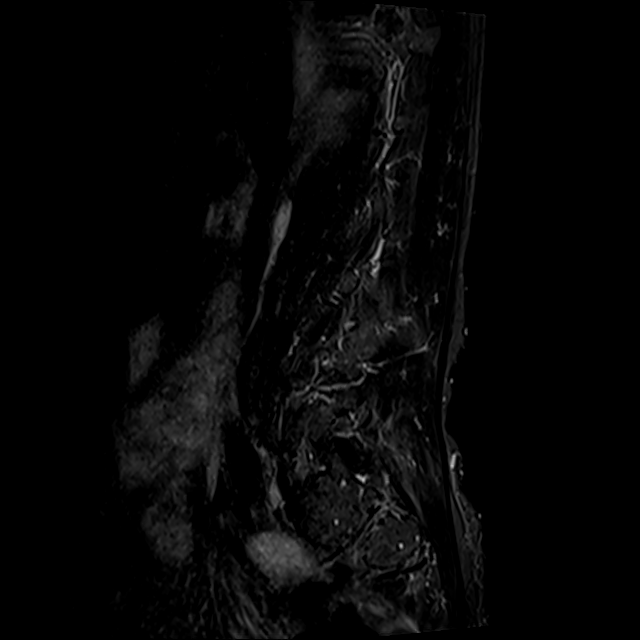

[Series 8: T2 · axial · 4.0mm · 0.78mm/px · z∈[-115,+103]mm · 8 of 36 slices shown (2 of 2)]
[im 1/36]
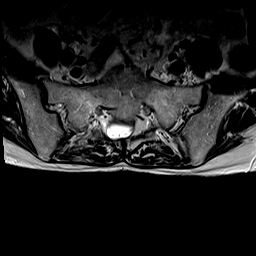
[im 6/36]
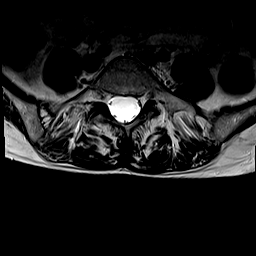
[im 11/36]
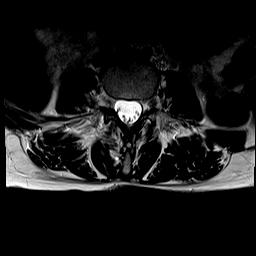
[im 17/36]
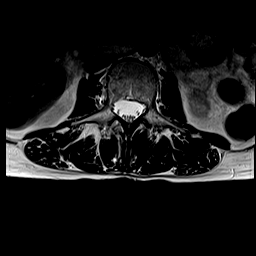
[im 19/36]
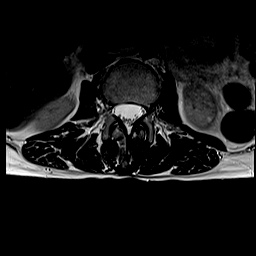
[im 25/36]
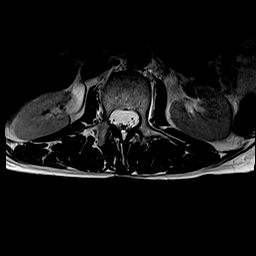
[im 30/36]
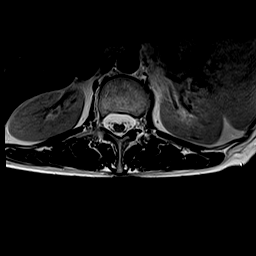
[im 36/36]
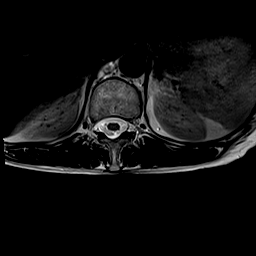

[Series 9: T1 · axial · 4.0mm · 0.39mm/px · z∈[-115,+103]mm · 8 of 36 slices shown (2 of 2)]
[im 1/36]
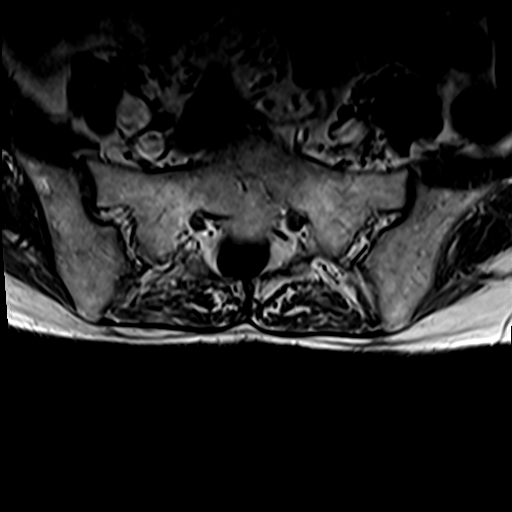
[im 6/36]
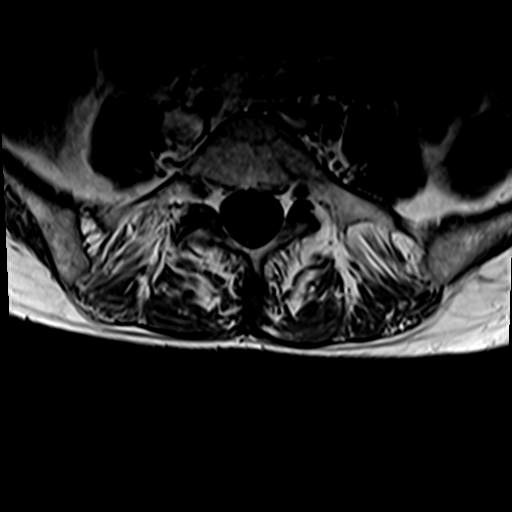
[im 11/36]
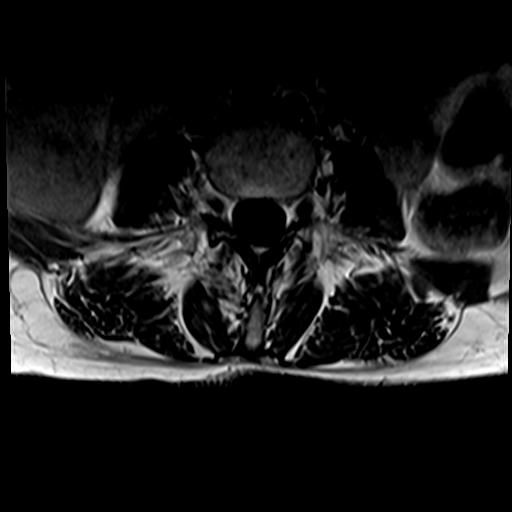
[im 17/36]
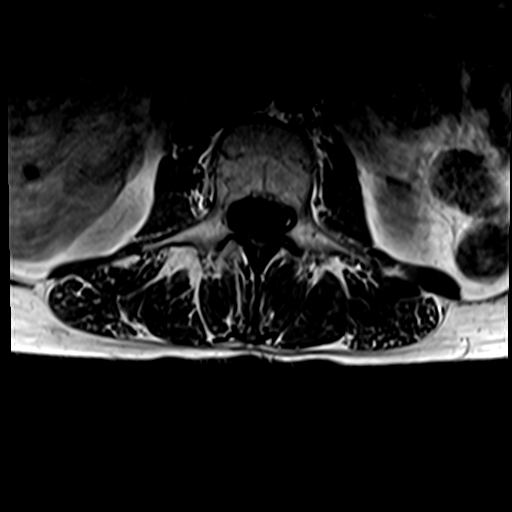
[im 19/36]
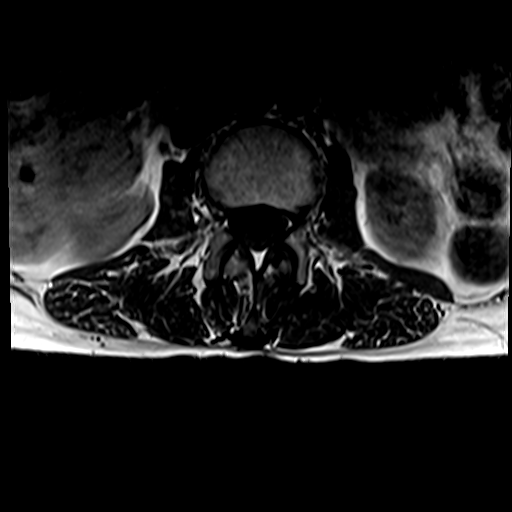
[im 25/36]
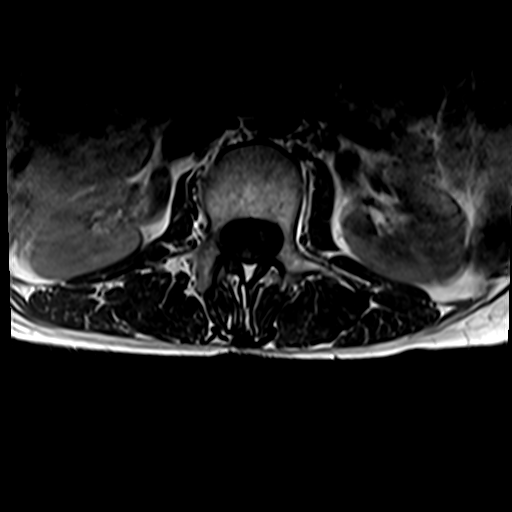
[im 30/36]
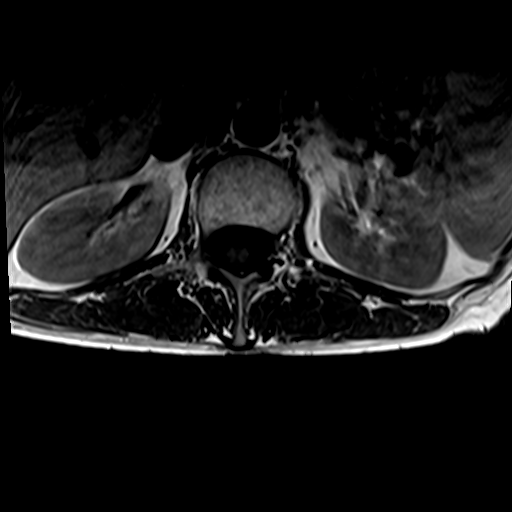
[im 36/36]
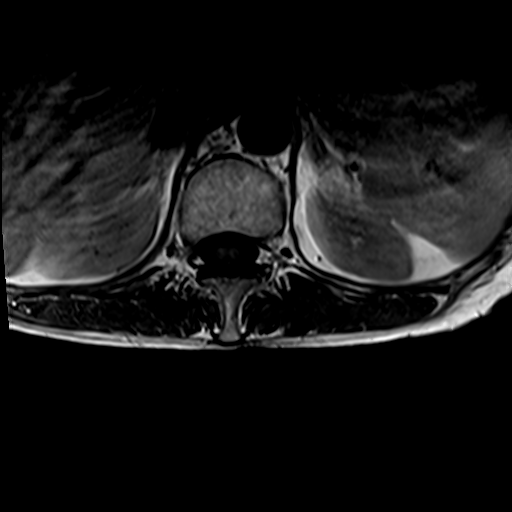

[31 of 48 positions shown; findings below may reference images not displayed]

FINDINGS: Segmentation:  Standard.

Alignment:  Lumbar levocurvature.  No listhesis.

Vertebrae: Normal bone marrow signal intensity. No focal osseous
lesions.

Conus medullaris and cauda equina: Conus extends to the L2 level.
Conus and cauda equina appear normal.

Disc levels: Multilevel desiccation.

L1-2: No significant disc bulge. Patent spinal canal and neural
foramen.

L2-3: Minimal disc bulge and facet degenerative spurring. Patent
spinal canal and neural foramen.

L3-4: Minimal disc bulge and facet degenerative spurring. Patent
spinal canal and right neural foramen. Mild left neural foraminal
narrowing.

L4-5: Minimal disc bulge with shallow right foraminal
protrusion/annular fissuring. Facet degenerative spurring. Patent
spinal canal. Mild bilateral neural foraminal narrowing.

L5-S1: Minimal disc bulge.  Patent spinal canal and neural foramen.

Paraspinal and other soft tissues: Small sacral Tarlov cyst.
IMPRESSION: Mild degenerative changes.  No significant spinal canal narrowing.

Mild left L3-4 and bilateral L4-5 neural foraminal narrowing.

## 2021-08-03 IMAGING — MR MR SHOULDER*L* W/O CM
5 series · 33 of 40 positions shown · non-contrast
Comparison: MRI left shoulder 12/05/2005. CT of the chest
07/27/2016. PA and lateral chest 01/02/2020.

CLINICAL DATA: Chronic left shoulder pain. History of basal cell
carcinoma of the left shoulder.

EXAM:
MRI OF THE LEFT SHOULDER WITHOUT CONTRAST
TECHNIQUE: Multiplanar, multisequence MR imaging of the shoulder was performed.
No intravenous contrast was administered.

[Series 5: T2 fat-sat · axial · left · 4.0mm · 0.55mm/px · z∈[-49,+71]mm · 8 of 26 slices shown (1 of 3)]
[im 1/26]
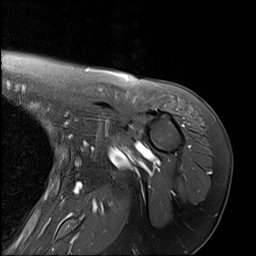
[im 3/26]
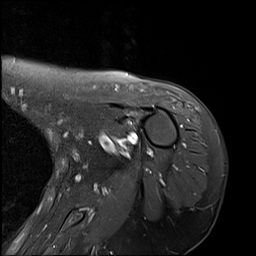
[im 9/26]
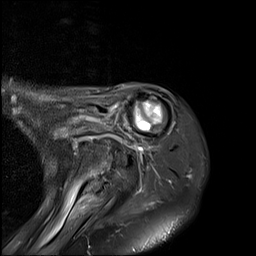
[im 12/26]
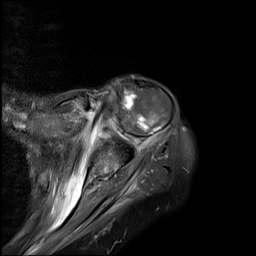
[im 14/26]
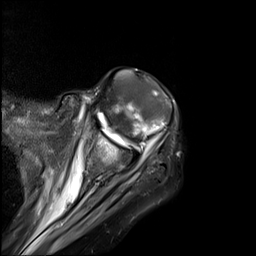
[im 17/26]
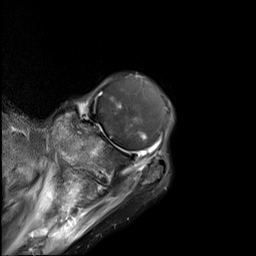
[im 23/26]
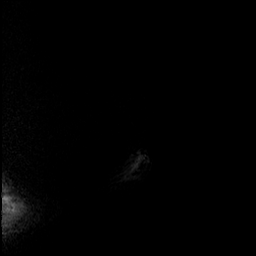
[im 26/26]
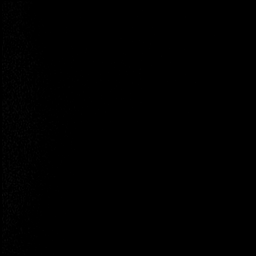

[Series 6: PD · sagittal · left · 4.0mm · 0.44mm/px · 9 of 26 slices shown]
[im 1/26]
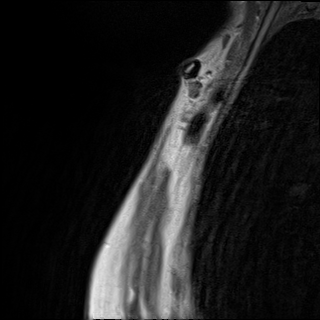
[im 4/26]
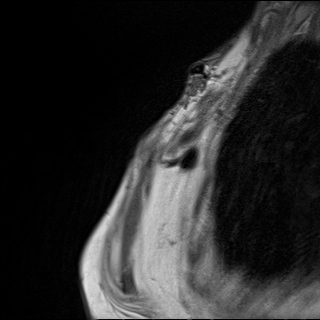
[im 7/26]
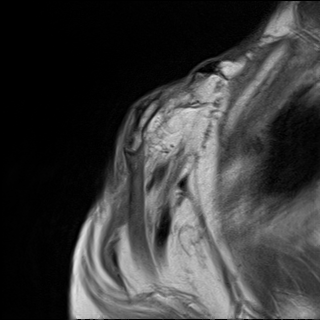
[im 10/26]
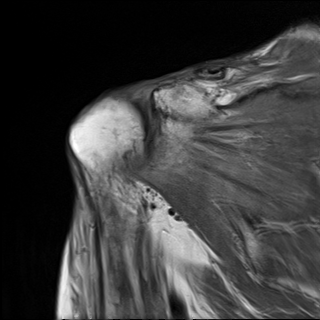
[im 13/26]
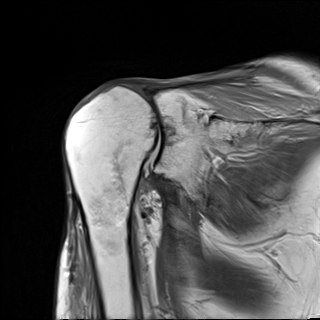
[im 16/26]
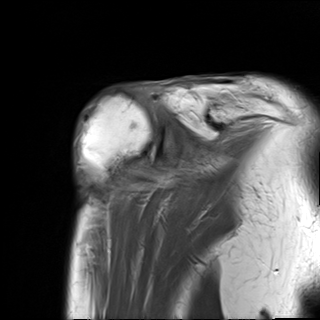
[im 19/26]
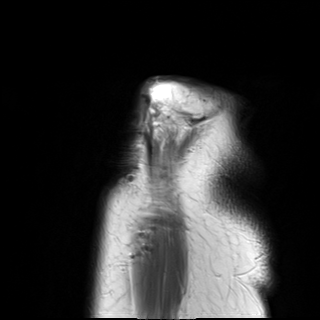
[im 22/26]
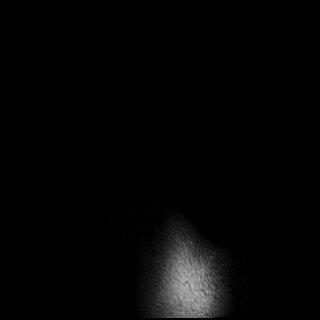
[im 26/26]
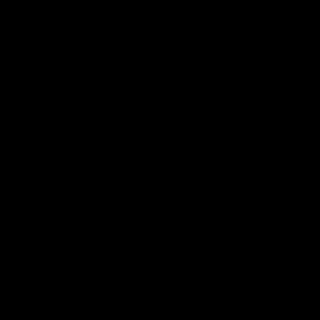

[Series 7: T2 fat-sat · sagittal · left · 4.0mm · 0.44mm/px · 9 of 26 slices shown (2 of 3)]
[im 1/26]
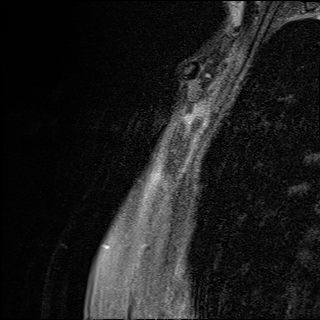
[im 4/26]
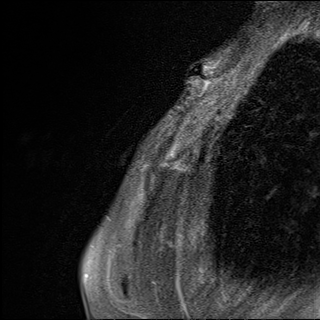
[im 7/26]
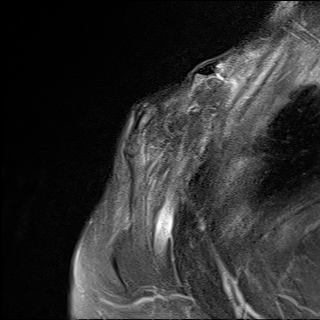
[im 10/26]
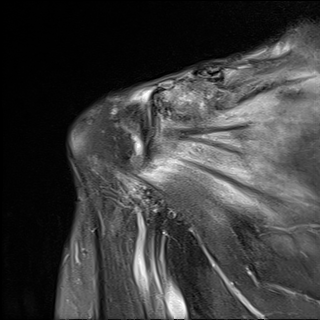
[im 13/26]
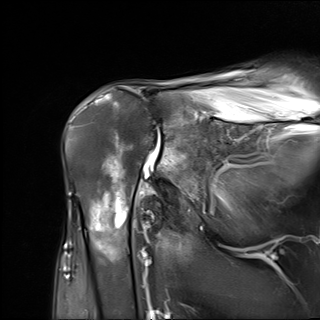
[im 16/26]
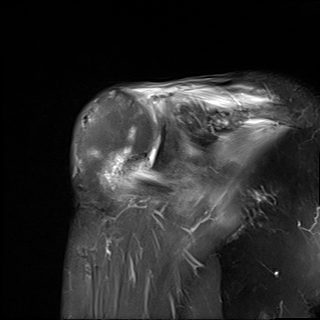
[im 19/26]
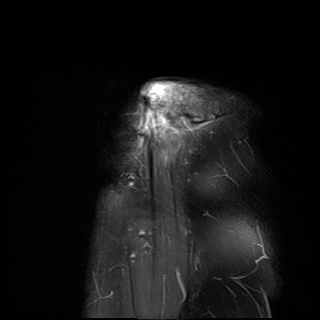
[im 22/26]
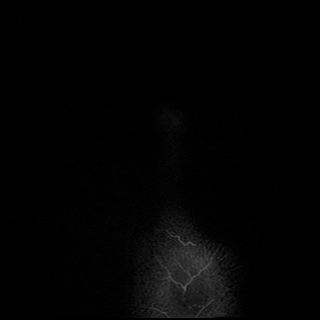
[im 26/26]
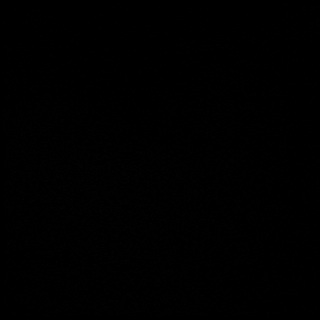

[Series 8: T2 fat-sat · oblique · left · 4.0mm · 0.23mm/px · 6 of 18 slices shown (3 of 3)]
[im 1/18]
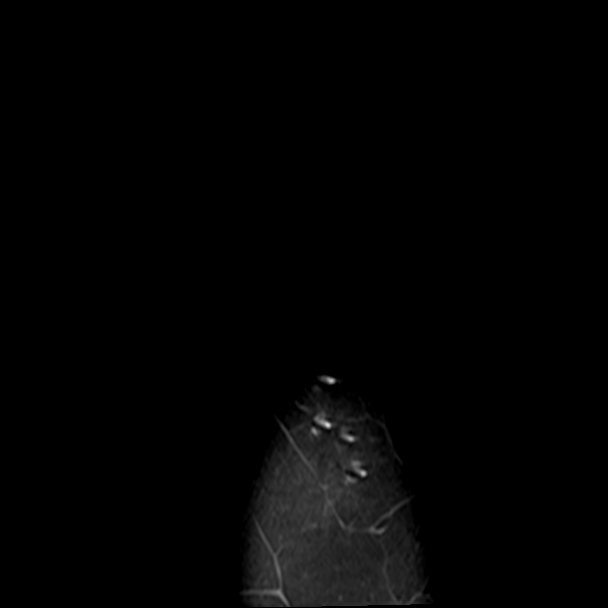
[im 4/18]
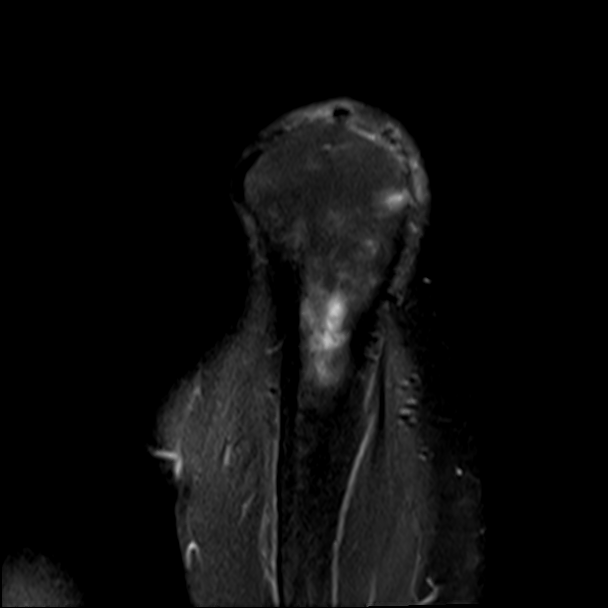
[im 7/18]
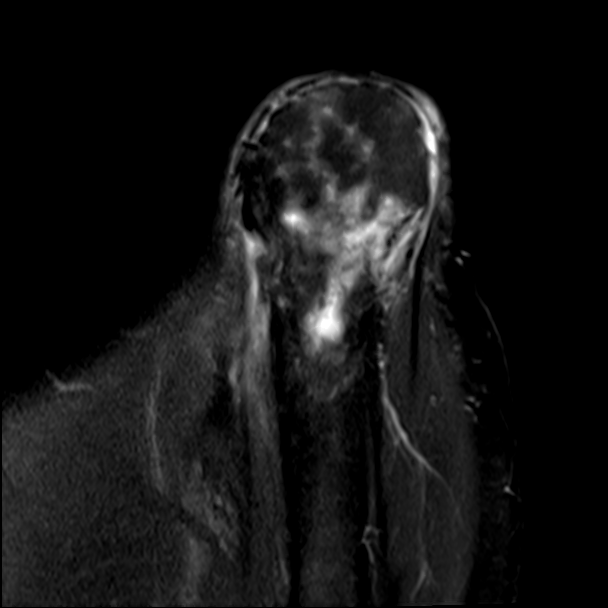
[im 11/18]
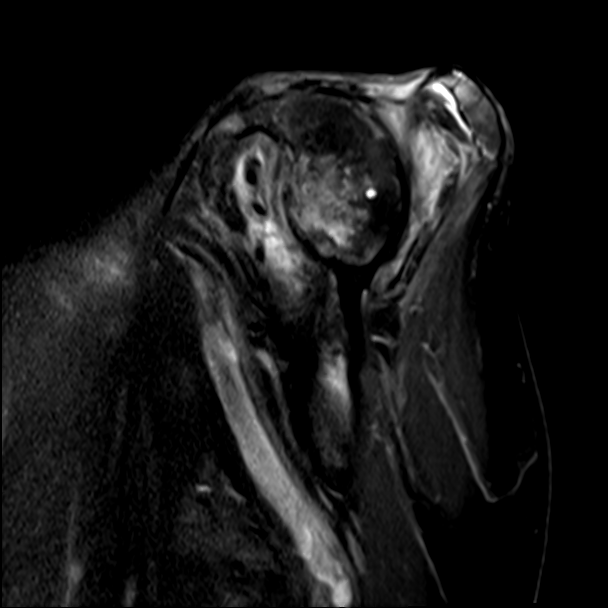
[im 14/18]
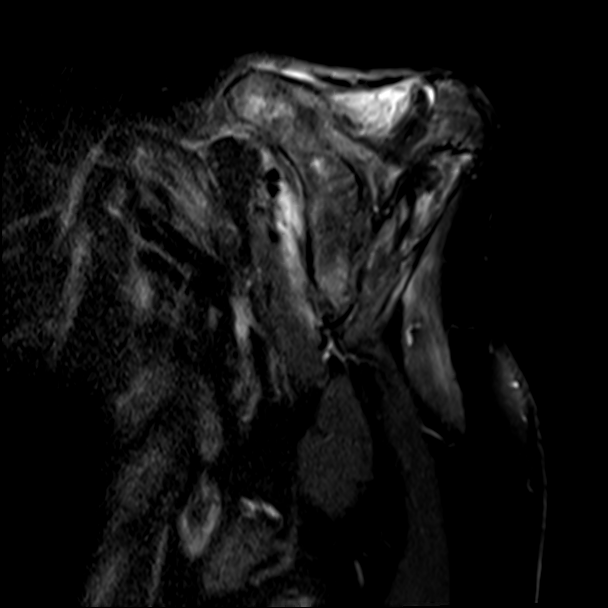
[im 18/18]
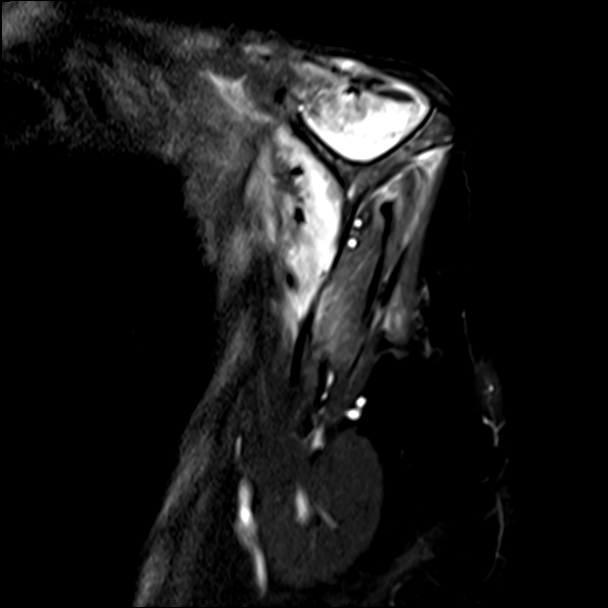

[Series 9: T1 · oblique · left · 4.0mm · 0.36mm/px · 1 of 18 slices shown]
[im 1/18]
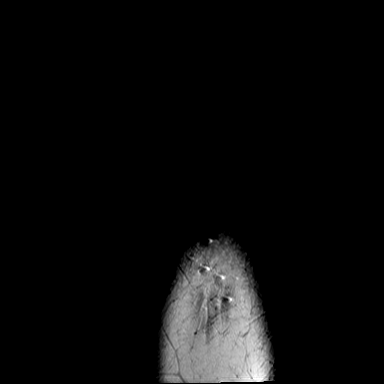

[33 of 40 positions shown; findings below may reference images not displayed]

FINDINGS: Rotator cuff: The supraspinatus and infraspinatus are completely
torn and retracted to approximately the level of the glenohumeral
joint. The subscapularis and teres minor appear intact.

Muscles: There is edema in the supraspinatus, infraspinatus and
subscapularis muscle bellies, much worse in the supraspinatus and
subscapularis. Mild fatty atrophy of the supraspinatus and
infraspinatus is identified. A large portion of the deltoid muscle
has been resected. Small foci of susceptibility artifact consistent
with prior surgery noted.

Biceps long head: The tendon does not attach to the superior labrum
consistent with either remote tear tenotomy.

Acromioclavicular Joint: The acromion and distal clavicle have been
resected.

Glenohumeral Joint: The patient has new moderate to moderately
severe glenohumeral osteoarthritis with marked cartilage loss,
subchondral edema in the glenoid and a few small subchondral cysts.
The inferior glenohumeral ligament is thickened with intermediate
increased T2 signal. Additionally, the humeral head is mildly
high-riding due to the patient's cuff tears.

Labrum:  Markedly degenerated.

Bones: There is some marrow edema in the proximal shaft of the
humerus likely due to stress change from altered mechanics. No
fracture or worrisome lesion.

Other: None.
IMPRESSION: Status post resection of a large portion of the deltoid, the distal
clavicle and acromion since the prior MRI.

Complete supraspinatus and infraspinatus tendon tears with
retraction to the glenohumeral joint.

Subscapularis and supraspinatus muscle edema is much worse than that
seen in the infraspinatus and consistent with atrophy and likely
strain. Mild infraspinatus atrophy also noted.

Complete tear of the long head of biceps versus biceps tenotomy.

Moderate to moderately severe glenohumeral osteoarthritis is new
since the prior examination.

No evidence of residual or recurrent basal cell carcinoma.

## 2021-08-04 ENCOUNTER — Other Ambulatory Visit: Payer: Self-pay | Admitting: Rheumatology

## 2021-08-04 DIAGNOSIS — M341 CR(E)ST syndrome: Secondary | ICD-10-CM

## 2021-08-04 DIAGNOSIS — C44619 Basal cell carcinoma of skin of left upper limb, including shoulder: Secondary | ICD-10-CM

## 2021-08-15 ENCOUNTER — Other Ambulatory Visit: Payer: Self-pay

## 2021-08-15 ENCOUNTER — Ambulatory Visit
Admission: RE | Admit: 2021-08-15 | Discharge: 2021-08-15 | Disposition: A | Discharge status: Home / Self Care | Payer: Medicare Other | Source: Ambulatory Visit | Attending: Rheumatology | Admitting: Rheumatology

## 2021-08-15 DIAGNOSIS — C44619 Basal cell carcinoma of skin of left upper limb, including shoulder: Secondary | ICD-10-CM | POA: Diagnosis not present

## 2021-08-15 DIAGNOSIS — M341 CR(E)ST syndrome: Secondary | ICD-10-CM | POA: Insufficient documentation

## 2021-11-01 ENCOUNTER — Other Ambulatory Visit: Payer: Self-pay

## 2021-11-01 ENCOUNTER — Encounter: Payer: Self-pay | Admitting: Emergency Medicine

## 2021-11-01 ENCOUNTER — Emergency Department
Admission: EM | Admit: 2021-11-01 | Discharge: 2021-11-01 | Disposition: A | Discharge status: Home / Self Care | Payer: Medicare Other | Attending: Emergency Medicine | Admitting: Emergency Medicine

## 2021-11-01 DIAGNOSIS — L500 Allergic urticaria: Secondary | ICD-10-CM | POA: Diagnosis not present

## 2021-11-01 DIAGNOSIS — J449 Chronic obstructive pulmonary disease, unspecified: Secondary | ICD-10-CM | POA: Insufficient documentation

## 2021-11-01 DIAGNOSIS — Z85828 Personal history of other malignant neoplasm of skin: Secondary | ICD-10-CM | POA: Diagnosis not present

## 2021-11-01 DIAGNOSIS — T7840XA Allergy, unspecified, initial encounter: Secondary | ICD-10-CM

## 2021-11-01 DIAGNOSIS — R0602 Shortness of breath: Secondary | ICD-10-CM | POA: Diagnosis present

## 2021-11-01 DIAGNOSIS — J9801 Acute bronchospasm: Secondary | ICD-10-CM | POA: Diagnosis not present

## 2021-11-01 MED ORDER — DIPHENHYDRAMINE HCL 50 MG/ML IJ SOLN
50.0000 mg | Freq: Once | INTRAMUSCULAR | Status: AC
  Administered 2021-11-01: 50 mg via INTRAVENOUS
  Filled 2021-11-01: qty 1

## 2021-11-01 MED ORDER — PREDNISONE 20 MG PO TABS: 60.0000 mg | ORAL_TABLET | Freq: Every day | ORAL | 0 refills | Status: AC

## 2021-11-01 MED ORDER — IPRATROPIUM-ALBUTEROL 0.5-2.5 (3) MG/3ML IN SOLN
3.0000 mL | Freq: Once | RESPIRATORY_TRACT | Status: AC
  Administered 2021-11-01: 3 mL via RESPIRATORY_TRACT
  Filled 2021-11-01: qty 3

## 2021-11-01 MED ORDER — METHYLPREDNISOLONE SODIUM SUCC 125 MG IJ SOLR
125.0000 mg | Freq: Once | INTRAMUSCULAR | Status: AC
  Administered 2021-11-01: 125 mg via INTRAVENOUS
  Filled 2021-11-01: qty 2

## 2021-11-01 MED ORDER — FAMOTIDINE IN NACL 20-0.9 MG/50ML-% IV SOLN
20.0000 mg | Freq: Once | INTRAVENOUS | Status: AC
  Administered 2021-11-01: 20 mg via INTRAVENOUS
  Filled 2021-11-01: qty 50

## 2021-11-01 NOTE — ED Provider Notes (Signed)
[facility], [place] Provider Note    Event Date/Time   First MD Initiated Contact with Patient 11/01/21 [phone]     (approximate)   History   Allergic Reaction   HPI  Sophia Bean is a 61 y.o. female history of scleroderma, COPD, chronic respiratory failure on 2 L nasal cannula, peripheral vascular disease who presents for evaluation of hives.  Patient reports that she had a tick bite last week and her doctor put her on azithromycin.  She took a full course with the last dose being Saturday morning.  That evening she started having hives.  She took some Benadryl and that went away.  This evening she started again with diffuse hives and also some shortness of breath which prompted her visit to the ER.  Patient denies any known prior allergic reactions to azithromycin.  She denies chest pain, throat closing sensation, nausea or vomiting, diarrhea, syncope.     Past Medical History:  Diagnosis Date   Anxiety    Arthritis    Cancer ([place])    skin   Cataract cortical, senile    Chronic respiratory failure (HCC)    COPD (chronic obstructive pulmonary disease) (HCC)    Depression    E coli infection    Osteoporosis, post-menopausal    Peripheral vascular disease ([place])    PVD (peripheral vascular disease) ([place])    Raynaud disease    Scleroderma ([place])    Scoliosis    Scoliosis     Past Surgical History:  Procedure Laterality Date   COLONOSCOPY WITH PROPOFOL N/A 04/05/2020   Procedure: COLONOSCOPY WITH PROPOFOL;  Surgeon: Lesly Rubenstein, MD;  Location: [facility] ENDOSCOPY;  Service: Endoscopy;  Laterality: N/A;   ESOPHAGOGASTRODUODENOSCOPY (EGD) WITH PROPOFOL N/A 04/05/2020   Procedure: ESOPHAGOGASTRODUODENOSCOPY (EGD) WITH PROPOFOL;  Surgeon: Lesly Rubenstein, MD;  Location: [facility] ENDOSCOPY;  Service: Endoscopy;  Laterality: N/A;   flap conjunctival     mohs Right    SHOULDER ARTHROSCOPY     times 9   toe nail removal     TOOTH EXTRACTION       Physical Exam    Triage Vital Signs: ED Triage Vitals  Enc Vitals Group     BP 11/01/21 0244 121/65     Pulse Rate 11/01/21 0244 82     Resp 11/01/21 0244 16     Temp 11/01/21 0244 98.3 F (36.8 C)     Temp Source 11/01/21 0244 Oral     SpO2 11/01/21 0244 100 %     Weight 11/01/21 0234 120 lb (54.4 kg)     Height 11/01/21 0234 '5\' 6"'$  (1.676 m)     Head Circumference --      Peak Flow --      Pain Score 11/01/21 0233 0     Pain Loc --      Pain Edu? --      Excl. in [place]? --     Most recent vital signs: Vitals:   11/01/21 0430 11/01/21 0500  BP: (!) 98/59 (!) 105/56  Pulse: 65 70  Resp: 15 17  Temp:    SpO2: 100% 99%     Constitutional: Alert and oriented. Chronically ill appearing HEENT:      Head: Normocephalic and atraumatic.         Eyes: Conjunctivae are normal. Sclera is non-icteric.       Mouth/Throat: Mucous membranes are moist. Oropharynx is clear, no angioedema, no stridor      Neck: Supple  with no signs of meningismus. Cardiovascular: Regular rate and rhythm. No murmurs, gallops, or rubs. 2+ symmetrical distal pulses are present in all extremities.  Respiratory: Normal respiratory effort. Lungs are clear to auscultation bilaterally with slight diminished air movement bilaterally with no wheezing Gastrointestinal: Soft, non tender, and non distended with positive bowel sounds. No rebound or guarding. Musculoskeletal:  No edema, cyanosis, or erythema of extremities. Neurologic: Normal speech and language. Face is symmetric. Moving all extremities. No gross focal neurologic deficits are appreciated. Skin: Skin is warm, dry and intact. Hives on torso Psychiatric: Mood and affect are normal. Speech and behavior are normal.  ED Results / Procedures / Treatments   Labs (all labs ordered are listed, but only abnormal results are displayed) Labs Reviewed - No data to display   EKG  none   RADIOLOGY none   PROCEDURES:  Critical Care performed:  No  Procedures    IMPRESSION / MDM / [place] / ED COURSE  I reviewed the triage vital signs and the nursing notes.  61 y.o. female history of scleroderma, COPD, chronic respiratory failure on 2 L nasal cannula, peripheral vascular disease who presents for evaluation of hives. No signs of anaphylaxis. Slightly tight lung sounds on auscultation but no wheezing, no difficulty breathing, no stridor, no angioedema. Mild bronchospasm in the setting of an allergic reaction. Will give a duoneb, solumedrol, IV benadryl and pepcid. Patient placed on telemetry for close monitoring of hemodynamics.   MEDICATIONS GIVEN IN ED: Medications  diphenhydrAMINE (BENADRYL) injection 50 mg (50 mg Intravenous Given 11/01/21 0351)  ipratropium-albuterol (DUONEB) 0.5-2.5 (3) MG/3ML nebulizer solution 3 mL (3 mLs Nebulization Given 11/01/21 0336)  famotidine (PEPCID) IVPB 20 mg premix (0 mg Intravenous Stopped 11/01/21 0439)  methylPREDNISolone sodium succinate (SOLU-MEDROL) 125 mg/2 mL injection 125 mg (125 mg Intravenous Given 11/01/21 0354)     ED COURSE: Patient symptoms have fully resolved after IV Benadryl, Pepcid, Solu-Medrol and a DuoNeb.  She feels markedly improved.  Back to baseline.  Azithromycin has been added to her list of allergies.  Will discharge home with a course of steroids and close follow-up with primary care doctor.  Patient was monitored for 3 hours with no signs of an anaphylaxis and is now stable for discharge   Consults: none   EMR reviewed including records from her last visit with her primary care doctor for a tick bite from a week ago    FINAL CLINICAL IMPRESSION(S) / ED DIAGNOSES   Final diagnoses:  Allergic reaction, initial encounter     Rx / DC Orders   ED Discharge Orders          Ordered    predniSONE (DELTASONE) 20 MG tablet  Daily with breakfast        11/01/21 0505             Note:  This document was prepared using Dragon voice recognition  software and may include unintentional dictation errors.   Please note:  Patient was evaluated in Emergency Department today for the symptoms described in the history of present illness. Patient was evaluated in the context of the global COVID-19 pandemic, which necessitated consideration that the patient might be at risk for infection with the SARS-CoV-2 virus that causes COVID-19. Institutional protocols and algorithms that pertain to the evaluation of patients at risk for COVID-19 are in a state of rapid change based on information released by regulatory bodies including the CDC and federal and state organizations. These policies and  algorithms were followed during the patient's care in the ED.  Some ED evaluations and interventions may be delayed as a result of limited staffing during the pandemic.       Alfred Levins, [place], MD 11/01/21 [phone]

## 2021-11-01 NOTE — ED Triage Notes (Signed)
Patient ambulatory to triage with steady gait, without difficulty or distress noted; pt reports generalized itchy rash since last night; st allergic rx to zithromax

## 2021-12-13 ENCOUNTER — Encounter: Payer: Medicare Other | Attending: Physician Assistant | Admitting: Physician Assistant

## 2021-12-13 DIAGNOSIS — C44619 Basal cell carcinoma of skin of left upper limb, including shoulder: Secondary | ICD-10-CM | POA: Insufficient documentation

## 2021-12-13 DIAGNOSIS — M340 Progressive systemic sclerosis: Secondary | ICD-10-CM | POA: Diagnosis not present

## 2021-12-13 DIAGNOSIS — L98494 Non-pressure chronic ulcer of skin of other sites with necrosis of bone: Secondary | ICD-10-CM | POA: Insufficient documentation

## 2021-12-13 DIAGNOSIS — M341 CR(E)ST syndrome: Secondary | ICD-10-CM | POA: Insufficient documentation

## 2021-12-13 DIAGNOSIS — T8131XA Disruption of external operation (surgical) wound, not elsewhere classified, initial encounter: Secondary | ICD-10-CM | POA: Diagnosis not present

## 2021-12-13 DIAGNOSIS — X58XXXA Exposure to other specified factors, initial encounter: Secondary | ICD-10-CM | POA: Diagnosis not present

## 2021-12-13 NOTE — Progress Notes (Signed)
Sophia Bean, Sophia Bean ([id number]) Visit Report for 12/13/2021 Abuse Risk Screen Details Patient Name: Sophia Bean, Sophia Bean Date of Service: 12/13/2021 12:45 PM Medical Record Number: [id number] Patient Account Number: [id number] Date of Birth/Sex: [birth year] (61 y.o. F) Treating RN: Cornell Barman Primary Care Saifullah Jolley: SYSTEM, PCP Other Clinician: Referring Jilian West: Ephriam Jenkins Treating Declyn Delsol/Extender: Skipper Cliche in Treatment: 0 Abuse Risk Screen Items Answer ABUSE RISK SCREEN: Has anyone close to you tried to hurt or harm you recentlyo No Do you feel uncomfortable with anyone in your familyo No Has anyone forced you do things that you didnot want to doo No Electronic Signature(s) Signed: 12/13/2021 3:46:53 PM By: Gretta Cool, BSN, RN, CWS, Kim RN, BSN Entered By: Gretta Cool, BSN, RN, CWS, Kim on 12/13/2021 13:13:23 Sophia Bean ([id number]) -------------------------------------------------------------------------------- Activities of Daily Living Details Patient Name: Sophia Bean Date of Service: 12/13/2021 12:45 PM Medical Record Number: [id number] Patient Account Number: [id number] Date of Birth/Sex: 03/08/1961 (60 y.o. F) Treating RN: Cornell Barman Primary Care Era Parr: SYSTEM, PCP Other Clinician: Referring Wilman Tucker: Ephriam Jenkins Treating Ashraf Mesta/Extender: Skipper Cliche in Treatment: 0 Activities of Daily Living Items Answer Activities of Daily Living (Please select one for each item) Drive Automobile Completely Able Take Medications Completely Able Use Telephone Completely Able Care for Appearance Completely Able Use Toilet Completely Able Bath / Shower Completely Able Dress Self Completely Able Feed Self Completely Able Walk Completely Able Get In / Out Bed Completely Able Housework Completely Able Prepare Meals Completely Able Handle Money Completely Able Shop for Self Completely Able Electronic Signature(s) Signed: 12/13/2021 3:46:53 PM By: Gretta Cool, BSN, RN, CWS, Kim RN, BSN Entered  By: Gretta Cool, BSN, RN, CWS, Kim on 12/13/2021 13:13:38 Sophia Bean ([id number]) -------------------------------------------------------------------------------- Education Screening Details Patient Name: Sophia Bean Date of Service: 12/13/2021 12:45 PM Medical Record Number: [id number] Patient Account Number: [id number] Date of Birth/Sex: Jan 02, 1961 (60 y.o. F) Treating RN: Cornell Barman Primary Care Jordyn Doane: SYSTEM, PCP Other Clinician: Referring Kallee Nam: Ephriam Jenkins Treating Budd Freiermuth/Extender: Skipper Cliche in Treatment: 0 Primary Learner Assessed: Patient Learning Preferences/Education Level/Primary Language Learning Preference: Explanation, Demonstration Preferred Language: English Cognitive Barrier Language Barrier: No Translator Needed: No Memory Deficit: No Emotional Barrier: No Cultural/Religious Beliefs Affecting Medical Care: No Physical Barrier Impaired Vision: Yes Glasses Impaired Hearing: No Decreased Hand dexterity: No Knowledge/Comprehension Knowledge Level: High Comprehension Level: High Ability to understand written instructions: High Ability to understand verbal instructions: High Motivation Anxiety Level: Calm Cooperation: Cooperative Education Importance: Acknowledges Need Interest in Health Problems: Asks Questions Perception: Coherent Willingness to Engage in Self-Management High Activities: Readiness to Engage in Self-Management High Activities: Engineer, maintenance) Signed: 12/13/2021 3:46:53 PM By: Gretta Cool, BSN, RN, CWS, Kim RN, BSN Entered By: Gretta Cool, BSN, RN, CWS, Kim on 12/13/2021 13:14:08 Sophia Bean ([id number]) -------------------------------------------------------------------------------- Fall Risk Assessment Details Patient Name: Sophia Bean Date of Service: 12/13/2021 12:45 PM Medical Record Number: [id number] Patient Account Number: [id number] Date of Birth/Sex: December 25, 1960 (60 y.o. F) Treating RN: Cornell Barman Primary Care Irbin Fines:  SYSTEM, PCP Other Clinician: Referring Maurizio Geno: Ephriam Jenkins Treating Oslo Huntsman/Extender: Skipper Cliche in Treatment: 0 Fall Risk Assessment Items Have you had 2 or more falls in the last 12 monthso 0 No Have you had any fall that resulted in injury in the last 12 monthso 0 No FALLS RISK SCREEN History of falling - immediate or within 3 months 0 No Secondary diagnosis (Do you have 2 or more medical diagnoseso) 0 No Ambulatory aid None/bed rest/wheelchair/nurse 0 Yes Crutches/cane/walker 0 No Furniture 0 No Intravenous therapy Access/Saline/Heparin Lock 0 No Gait/Transferring Normal/  bed rest/ wheelchair 0 Yes Weak (short steps with or without shuffle, stooped but able to lift head while walking, may 0 No seek support from furniture) Impaired (short steps with shuffle, may have difficulty arising from chair, head down, impaired 0 No balance) Mental Status Oriented to own ability 0 Yes Electronic Signature(s) Signed: 12/13/2021 3:46:53 PM By: Gretta Cool, BSN, RN, CWS, Kim RN, BSN Entered By: Gretta Cool, BSN, RN, CWS, Kim on 12/13/2021 13:14:29 Sophia Bean ([id number]) -------------------------------------------------------------------------------- Foot Assessment Details Patient Name: Sophia Bean Date of Service: 12/13/2021 12:45 PM Medical Record Number: [id number] Patient Account Number: [id number] Date of Birth/Sex: February 07, 1961 (61 y.o. F) Treating RN: Cornell Barman Primary Care Edwyn Inclan: SYSTEM, PCP Other Clinician: Referring Makara Lanzo: Ephriam Jenkins Treating Taheerah Guldin/Extender: Skipper Cliche in Treatment: 0 Foot Assessment Items Site Locations + = Sensation present, - = Sensation absent, C = Callus, U = Ulcer R = Redness, W = Warmth, M = Maceration, PU = Pre-ulcerative lesion F = Fissure, S = Swelling, D = Dryness Assessment Right: Left: Other Deformity: No No Prior Foot Ulcer: No No Prior Amputation: No No Charcot Joint: No No Ambulatory Status: Ambulatory Without  Help Gait: Steady Electronic Signature(s) Signed: 12/13/2021 3:46:53 PM By: Gretta Cool, BSN, RN, CWS, Kim RN, BSN Entered By: Gretta Cool, BSN, RN, CWS, Kim on 12/13/2021 13:15:13 Sophia Bean ([id number]) -------------------------------------------------------------------------------- Nutrition Risk Screening Details Patient Name: Sophia Bean Date of Service: 12/13/2021 12:45 PM Medical Record Number: [id number] Patient Account Number: [id number] Date of Birth/Sex: [birth year] (60 y.o. F) Treating RN: Cornell Barman Primary Care Malosi Hemstreet: SYSTEM, PCP Other Clinician: Referring Tamalyn Wadsworth: Ephriam Jenkins Treating Weslyn Holsonback/Extender: Skipper Cliche in Treatment: 0 Height (in): 66 Weight (lbs): 120 Body Mass Index (BMI): 19.4 Nutrition Risk Screening Items Score Screening NUTRITION RISK SCREEN: I have an illness or condition that made me change the kind and/or amount of food I eat 0 No I eat fewer than two meals per day 0 No I eat few fruits and vegetables, or milk products 0 No I have three or more drinks of beer, liquor or wine almost every day 0 No I have tooth or mouth problems that make it hard for me to eat 0 No I don't always have enough money to buy the food I need 0 No I eat alone most of the time 0 No I take three or more different prescribed or over-the-counter drugs a day 0 No Without wanting to, I have lost or gained 10 pounds in the last six months 0 No I am not always physically able to shop, cook and/or feed myself 0 No Nutrition Protocols Good Risk Protocol Moderate Risk Protocol 0 Provide education on nutrition High Risk Proctocol Risk Level: Good Risk Score: 0 Electronic Signature(s) Signed: 12/13/2021 3:46:53 PM By: Gretta Cool, BSN, RN, CWS, Kim RN, BSN Entered By: Gretta Cool, BSN, RN, CWS, Kim on 12/13/2021 13:15:03

## 2021-12-13 NOTE — Progress Notes (Signed)
Sophia, Bean ([id number]) Visit Report for 12/13/2021 Allergy List Details Patient Name: Sophia Bean, Sophia Bean Date of Service: 12/13/2021 12:45 PM Medical Record Number: [id number] Patient Account Number: [id number] Date of Birth/Sex: 09-30-1960 (61 y.o. F) Treating RN: Cornell Barman Primary Care Godfrey Tritschler: SYSTEM, PCP Other Clinician: Referring Makaelah Cranfield: Ephriam Jenkins Treating Mercie Balsley/Extender: Skipper Cliche in Treatment: 0 Allergies Active Allergies penicillin azithromycin Allergy Notes Electronic Signature(s) Signed: 12/13/2021 3:46:53 PM By: Gretta Cool, BSN, RN, CWS, Kim RN, BSN Entered By: Gretta Cool, BSN, RN, CWS, Kim on 12/13/2021 13:09:11 Sophia Bean ([id number]) -------------------------------------------------------------------------------- Arrival Information Details Patient Name: Sophia Bean Date of Service: 12/13/2021 12:45 PM Medical Record Number: [id number] Patient Account Number: [id number] Date of Birth/Sex: 07/15/1960 (60 y.o. F) Treating RN: Cornell Barman Primary Care Hallie Ishida: SYSTEM, PCP Other Clinician: Referring Leonette Tischer: Ephriam Jenkins Treating Danese Dorsainvil/Extender: Skipper Cliche in Treatment: 0 Visit Information Patient Arrived: Ambulatory Arrival Time: 13:03 Accompanied By: self Transfer Assistance: None Patient Identification Verified: Yes Secondary Verification Process Completed: Yes Electronic Signature(s) Signed: 12/13/2021 3:46:53 PM By: Gretta Cool, BSN, RN, CWS, Kim RN, BSN Entered By: Gretta Cool, BSN, RN, CWS, Kim on 12/13/2021 13:03:30 Sophia Bean ([id number]) -------------------------------------------------------------------------------- Clinic Level of Care Assessment Details Patient Name: Sophia Bean Date of Service: 12/13/2021 12:45 PM Medical Record Number: [id number] Patient Account Number: [id number] Date of Birth/Sex: July 26, 1960 (61 y.o. F) Treating RN: Cornell Barman Primary Care Deette Revak: SYSTEM, PCP Other Clinician: Referring Larence Thone: Ephriam Jenkins Treating  Arletha Marschke/Extender: Skipper Cliche in Treatment: 0 Clinic Level of Care Assessment Items TOOL 1 Quantity Score '[]'$  - Use when EandM and Procedure is performed on INITIAL visit 0 ASSESSMENTS - Nursing Assessment / Reassessment X - General Physical Exam (combine w/ comprehensive assessment (listed just below) when performed on new 1 20 pt. evals) X- 1 25 Comprehensive Assessment (HX, ROS, Risk Assessments, Wounds Hx, etc.) ASSESSMENTS - Wound and Skin Assessment / Reassessment '[]'$  - Dermatologic / Skin Assessment (not related to wound area) 0 ASSESSMENTS - Ostomy and/or Continence Assessment and Care '[]'$  - Incontinence Assessment and Management 0 '[]'$  - 0 Ostomy Care Assessment and Management (repouching, etc.) PROCESS - Coordination of Care X - Simple Patient / Family Education for ongoing care 1 15 '[]'$  - 0 Complex (extensive) Patient / Family Education for ongoing care '[]'$  - 0 Staff obtains Programmer, systems, Records, Test Results / Process Orders '[]'$  - 0 Staff telephones HHA, Nursing Homes / Clarify orders / etc '[]'$  - 0 Routine Transfer to another Facility (non-emergent condition) '[]'$  - 0 Routine Hospital Admission (non-emergent condition) X- 1 15 New Admissions / Biomedical engineer / Ordering NPWT, Apligraf, etc. '[]'$  - 0 Emergency Hospital Admission (emergent condition) PROCESS - Special Needs '[]'$  - Pediatric / Minor Patient Management 0 '[]'$  - 0 Isolation Patient Management '[]'$  - 0 Hearing / Language / Visual special needs '[]'$  - 0 Assessment of Community assistance (transportation, D/C planning, etc.) '[]'$  - 0 Additional assistance / Altered mentation '[]'$  - 0 Support Surface(s) Assessment (bed, cushion, seat, etc.) INTERVENTIONS - Miscellaneous '[]'$  - External ear exam 0 '[]'$  - 0 Patient Transfer (multiple staff / Civil Service fast streamer / Similar devices) '[]'$  - 0 Simple Staple / Suture removal (25 or less) '[]'$  - 0 Complex Staple / Suture removal (26 or more) '[]'$  - 0 Hypo/Hyperglycemic Management  (do not check if billed separately) '[]'$  - 0 Ankle / Brachial Index (ABI) - do not check if billed separately Has the patient been seen at the hospital within the last three years: Yes Total Score: 75 Level Of Care: New/Established - Level  2 MONICE, LUNDY ([id number]) Electronic Signature(s) Signed: 12/13/2021 3:46:53 PM By: Gretta Cool, BSN, RN, CWS, Kim RN, BSN Entered By: Gretta Cool, BSN, RN, CWS, Kim on 12/13/2021 13:31:31 Sophia Bean ([id number]) -------------------------------------------------------------------------------- Encounter Discharge Information Details Patient Name: Sophia Bean Date of Service: 12/13/2021 12:45 PM Medical Record Number: [id number] Patient Account Number: [id number] Date of Birth/Sex: Jan 18, 1961 (60 y.o. F) Treating RN: Cornell Barman Primary Care Lennon Boutwell: SYSTEM, PCP Other Clinician: Referring Claryssa Sandner: Ephriam Jenkins Treating Edmar Blankenburg/Extender: Skipper Cliche in Treatment: 0 Encounter Discharge Information Items Discharge Condition: Stable Ambulatory Status: Ambulatory Discharge Destination: Home Transportation: Private Auto Accompanied By: self Schedule Follow-up Appointment: No Clinical Summary of Care: Electronic Signature(s) Signed: 12/13/2021 3:46:53 PM By: Gretta Cool, BSN, RN, CWS, Kim RN, BSN Entered By: Gretta Cool, BSN, RN, CWS, Kim on 12/13/2021 13:33:01 Sophia Bean ([id number]) -------------------------------------------------------------------------------- Lower Extremity Assessment Details Patient Name: Sophia Bean Date of Service: 12/13/2021 12:45 PM Medical Record Number: [id number] Patient Account Number: [id number] Date of Birth/Sex: 1960/11/20 (60 y.o. F) Treating RN: Cornell Barman Primary Care Campbell Agramonte: SYSTEM, PCP Other Clinician: Referring Naasia Weilbacher: Ephriam Jenkins Treating Kazue Cerro/Extender: Skipper Cliche in Treatment: 0 Electronic Signature(s) Signed: 12/13/2021 3:46:53 PM By: Gretta Cool, BSN, RN, CWS, Kim RN, BSN Entered By: Gretta Cool, BSN, RN, CWS, Kim on  12/13/2021 13:08:17 Sophia Bean ([id number]) -------------------------------------------------------------------------------- Multi Wound Chart Details Patient Name: Sophia Bean Date of Service: 12/13/2021 12:45 PM Medical Record Number: [id number] Patient Account Number: [id number] Date of Birth/Sex: 1961-05-25 (60 y.o. F) Treating RN: Cornell Barman Primary Care Rasheeda Mulvehill: SYSTEM, PCP Other Clinician: Referring Rashauna Tep: Ephriam Jenkins Treating Bastion Bolger/Extender: Skipper Cliche in Treatment: 0 Vital Signs Height(in): 66 Pulse(bpm): 75 Weight(lbs): 120 Blood Pressure(mmHg): 112/66 Body Mass Index(BMI): 19.4 Temperature(F): 97.8 Respiratory Rate(breaths/min): 16 Photos: [N/A:N/A] Wound Location: Left Shoulder N/A N/A Wounding Event: Gradually Appeared N/A N/A Primary Etiology: Malignant Wound N/A N/A Date Acquired: 06/15/2021 N/A N/A Weeks of Treatment: 0 N/A N/A Wound Status: Open N/A N/A Wound Recurrence: No N/A N/A Measurements L x W x D (cm) 1x1x0.1 N/A N/A Area (cm) : 0.785 N/A N/A Volume (cm) : 0.079 N/A N/A Classification: Full Thickness With Exposed N/A N/A Support Structures Exudate Amount: None Present N/A N/A Granulation Amount: None Present (0%) N/A N/A Necrotic Amount: None Present (0%) N/A N/A Exposed Structures: Bone: Yes N/A N/A Fascia: No Fat Layer (Subcutaneous Tissue): No Tendon: No Muscle: No Joint: No Epithelialization: None N/A N/A Treatment Notes Electronic Signature(s) Signed: 12/13/2021 3:46:53 PM By: Gretta Cool, BSN, RN, CWS, Kim RN, BSN Entered By: Gretta Cool, BSN, RN, CWS, Kim on 12/13/2021 13:30:22 Sophia Bean ([id number]) -------------------------------------------------------------------------------- Multi-Disciplinary Care Plan Details Patient Name: Sophia Bean Date of Service: 12/13/2021 12:45 PM Medical Record Number: [id number] Patient Account Number: [id number] Date of Birth/Sex: [birth year] (60 y.o. F) Treating RN: Cornell Barman Primary Care  Adali Pennings: SYSTEM, PCP Other Clinician: Referring Gurnoor Ursua: Ephriam Jenkins Treating Marlowe Cinquemani/Extender: Skipper Cliche in Treatment: 0 Active Inactive Electronic Signature(s) Signed: 12/13/2021 3:46:53 PM By: Gretta Cool, BSN, RN, CWS, Kim RN, BSN Entered By: Gretta Cool, BSN, RN, CWS, Kim on 12/13/2021 13:30:13 Sophia Bean ([id number]) -------------------------------------------------------------------------------- Pain Assessment Details Patient Name: Sophia Bean Date of Service: 12/13/2021 12:45 PM Medical Record Number: [id number] Patient Account Number: [id number] Date of Birth/Sex: 06-Oct-1960 (60 y.o. F) Treating RN: Cornell Barman Primary Care Rameen Gohlke: SYSTEM, PCP Other Clinician: Referring Oluwasemilore Bahl: Ephriam Jenkins Treating Neldon Shepard/Extender: Skipper Cliche in Treatment: 0 Active Problems Location of Pain Severity and Description of Pain Patient Has Paino No Site Locations Pain Management and Medication Current Pain Management: Notes No pain in wound area  Electronic Signature(s) Signed: 12/13/2021 3:46:53 PM By: Gretta Cool, BSN, RN, CWS, Kim RN, BSN Entered By: Gretta Cool, BSN, RN, CWS, Kim on 12/13/2021 13:03:51 Sophia Bean ([id number]) -------------------------------------------------------------------------------- Patient/Caregiver Education Details Patient Name: Sophia Bean Date of Service: 12/13/2021 12:45 PM Medical Record Number: [id number] Patient Account Number: [id number] Date of Birth/Gender: 1960/08/20 (60 y.o. F) Treating RN: Cornell Barman Primary Care Physician: SYSTEM, PCP Other Clinician: Referring Physician: Ephriam Jenkins Treating Physician/Extender: Skipper Cliche in Treatment: 0 Education Assessment Education Provided To: Patient Education Topics Provided Wound/Skin Impairment: Handouts: Other: follow-up with Plastic Surgery Methods: Explain/Verbal Electronic Signature(s) Signed: 12/13/2021 3:46:53 PM By: Gretta Cool, BSN, RN, CWS, Kim RN, BSN Entered By: Gretta Cool, BSN, RN, CWS,  Kim on 12/13/2021 13:32:25 Sophia Bean ([id number]) -------------------------------------------------------------------------------- Wound Assessment Details Patient Name: Sophia Bean Date of Service: 12/13/2021 12:45 PM Medical Record Number: [id number] Patient Account Number: [id number] Date of Birth/Sex: [birth year] (60 y.o. F) Treating RN: Cornell Barman Primary Care Zakary Kimura: SYSTEM, PCP Other Clinician: Referring Lynette Topete: Ephriam Jenkins Treating Brigitta Pricer/Extender: Skipper Cliche in Treatment: 0 Wound Status Wound Number: 1 Primary Etiology: Malignant Wound Wound Location: Left Shoulder Wound Status: Open Wounding Event: Gradually Appeared Date Acquired: 06/15/2021 Weeks Of Treatment: 0 Clustered Wound: No Photos Wound Measurements Length: (cm) 1 Width: (cm) 1 Depth: (cm) 0.1 Area: (cm) 0.785 Volume: (cm) 0.079 % Reduction in Area: % Reduction in Volume: Epithelialization: None Tunneling: No Undermining: No Wound Description Classification: Full Thickness With Exposed Support Structures Exudate Amount: None Present Foul Odor After Cleansing: No Slough/Fibrino No Wound Bed Granulation Amount: None Present (0%) Exposed Structure Necrotic Amount: None Present (0%) Fascia Exposed: No Fat Layer (Subcutaneous Tissue) Exposed: No Tendon Exposed: No Muscle Exposed: No Joint Exposed: No Bone Exposed: Yes Treatment Notes Wound #1 (Shoulder) Wound Laterality: Left Cleanser Peri-Wound Care Topical Primary Dressing TEQUITA, MARRS ([id number]) Secondary Dressing Secured With Compression Wrap Compression Stockings Add-Ons Electronic Signature(s) Signed: 12/13/2021 3:46:53 PM By: Gretta Cool, BSN, RN, CWS, Kim RN, BSN Entered By: Gretta Cool, BSN, RN, CWS, Kim on 12/13/2021 13:08:07 Sophia Bean ([id number]) -------------------------------------------------------------------------------- [place] Details Patient Name: Sophia Bean Date of Service: 12/13/2021 12:45 PM Medical Record  Number: [id number] Patient Account Number: [id number] Date of Birth/Sex: [birth year] (61 y.o. F) Treating RN: Cornell Barman Primary Care Violet Cart: SYSTEM, PCP Other Clinician: Referring Phuong Hillary: Ephriam Jenkins Treating Jameir Ake/Extender: Skipper Cliche in Treatment: 0 Vital Signs Time Taken: 13:02 Temperature (F): 97.8 Height (in): 66 Pulse (bpm): 75 Weight (lbs): 120 Respiratory Rate (breaths/min): 16 Body Mass Index (BMI): 19.4 Blood Pressure (mmHg): 112/66 Reference Range: 80 - 120 mg / dl Electronic Signature(s) Signed: 12/13/2021 3:46:53 PM By: Gretta Cool, BSN, RN, CWS, Kim RN, BSN Entered By: Gretta Cool, BSN, RN, CWS, Kim on 12/13/2021 13:04:21

## 2021-12-15 NOTE — Progress Notes (Addendum)
[place], ZIP[place] ([id number]) Visit Report for 12/13/2021 Chief Complaint Document Details Patient Name: Sophia Bean, Sophia Bean Date of Service: 12/13/2021 12:45 PM Medical Record Number: [id number] Patient Account Number: [id number] Date of Birth/Sex: [birth year] (61 y.o. F) Treating RN: Cornell Barman Primary Care Provider: SYSTEM, PCP Other Clinician: Referring Provider: Ephriam Jenkins Treating Provider/Extender: Skipper Cliche in Treatment: 0 Information Obtained from: Patient Chief Complaint Left shoulder ulcer Electronic Signature(s) Signed: 12/13/2021 1:25:10 PM By: Worthy Keeler PA-C Entered By: Worthy Keeler on 12/13/2021 13:25:10 Sophia Bean ([id number]) -------------------------------------------------------------------------------- HPI Details Patient Name: Sophia Bean Date of Service: 12/13/2021 12:45 PM Medical Record Number: [id number] Patient Account Number: [id number] Date of Birth/Sex: 04-Jul-1960 (61 y.o. F) Treating RN: Cornell Barman Primary Care Provider: SYSTEM, PCP Other Clinician: Referring Provider: Ephriam Jenkins Treating Provider/Extender: Skipper Cliche in Treatment: 0 History of Present Illness HPI Description: 12-13-2021 upon evaluation today patient presents for initial inspection here in the clinic today concerning issues that she has been having with a wound on her left shoulder. This is subsequent to a significant surgery that she has had in fact multiple surgeries that stemmed from her having a basal cell carcinoma. Subsequently she also has a history of scleroderma which has caused a lot of issues and I think is causing some issues here with the wound area as well. 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 Subsequently the patient tells me she is coming in today for evaluation in order to see if there is anything that can be done or recommended to help out with what is currently  going on. Fortunately I do not see any signs of infection unfortunately I do see bone protruding from the left shoulder. This is dry and to be honest I think there really is a good to be much from a wound care perspective that I can do to fix this area. The patient did have an MRI on 07-07-2020 which showed arthritis but no infection at that time. Electronic Signature(s) Signed: 12/16/2021 3:56:32 PM By: Worthy Keeler PA-C Previous Signature: 12/13/2021 5:25:19 PM Version By: Worthy Keeler PA-C Entered By: Worthy Keeler on 12/16/2021 15:56:32 Blann, Santiago Glad ([id number]) -------------------------------------------------------------------------------- Physical Exam Details Patient Name: Sophia Bean Date of Service: 12/13/2021 12:45 PM Medical Record Number: [id number] Patient Account Number: [id number] Date of Birth/Sex: 04-18-1961 (61 y.o. F) Treating RN: Cornell Barman Primary Care Provider: SYSTEM, PCP Other Clinician: Referring Provider: Ephriam Jenkins Treating Provider/Extender: Skipper Cliche in Treatment: 0 Constitutional sitting or standing blood pressure is within target range for patient.. pulse regular and within target range for patient.Marland Kitchen respirations regular, non- labored and within target range for patient.Marland Kitchen temperature within target range for patient.. Well-nourished and well-hydrated in no acute distress. Eyes conjunctiva clear no eyelid edema noted. pupils equal round and reactive to light and accommodation. Ears, Nose, Mouth, and Throat no gross abnormality of ear auricles or external auditory canals. normal hearing noted during conversation. mucus membranes moist. Respiratory normal breathing without difficulty. Musculoskeletal normal gait and posture. no significant deformity or arthritic changes, no loss or range of motion, no clubbing. Psychiatric this patient is able to make decisions and demonstrates good insight into disease process. Alert and Oriented x 3. pleasant and  cooperative. Notes Upon inspection patient's wound bed actually showed signs of again bone protruding which was dry and starting to flake to a degree from the left shoulder area in the upper scapular region.  Subsequently I do not see anything that appears to be infected obviously but at the same time this bone being dry as it is I think is good to make it extremely difficult to actually be able to heal this area. I think that the only manner in which I can perceive this would happen is if she was able to actually have this debrided away down to healthier bone and then potentially even have a skin graft or some sort applied. With that being said I am not even certain that she is healthy enough to go through this process to be honest that is outside of my scope. I do think it would be helpful for her to go back to plastic surgery and see what they have to say. Electronic Signature(s) Signed: 12/16/2021 3:57:44 PM By: Worthy Keeler PA-C Entered By: Worthy Keeler on 12/16/2021 15:57:44 Sophia Bean ([id number]) -------------------------------------------------------------------------------- Physician Orders Details Patient Name: Sophia Bean Date of Service: 12/13/2021 12:45 PM Medical Record Number: [id number] Patient Account Number: [id number] Date of Birth/Sex: 05-18-1961 (61 y.o. 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] M34.0 Progressive systemic sclerosis M34.1 CR(E)ST syndrome Discharge  From [facility] Only Wound Treatment Electronic Signature(s) Signed: 12/13/2021 3:46:53 PM By: Gretta Cool, BSN, RN, CWS, Kim RN, BSN Signed: 12/15/2021 4:35:29 PM By: Worthy Keeler PA-C Entered By: Gretta Cool, BSN, RN, CWS, Kim on 12/13/2021 13:31:09 Sophia Bean ([id number]) -------------------------------------------------------------------------------- Problem List Details Patient Name: Sophia Bean Date of Service: 12/13/2021 12:45 PM Medical Record Number: [id number] Patient Account Number: [id number] Date of Birth/Sex: 1960-09-23 (60 y.o. 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 F) Treating RN: Cornell Barman Primary Care Provider: SYSTEM, PCP Other Clinician: Referring Provider: Ephriam Jenkins Treating Provider/Extender: Skipper Cliche in Treatment:  0 Subjective Chief Complaint Information obtained from Patient Left shoulder ulcer History of Present Illness (HPI) 12-13-2021 upon evaluation today patient presents for initial inspection here in the clinic today concerning issues that she has been having with a wound on her left shoulder. This is subsequent to a significant surgery that she has had in fact multiple surgeries that stemmed from her having a basal cell carcinoma. Subsequently she also has a history of scleroderma which has caused a lot of issues  and I think is causing some issues here with the wound area as well. 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 Subsequently the patient tells me she is coming in today for evaluation in order to see if there is anything that can be done or recommended to help out with what is currently going on. Fortunately I do not see any signs of infection unfortunately I do see bone protruding from the left shoulder. This is dry and to be honest I think there really is a good to be much from a wound care perspective that I can do to fix this area. The patient did have an MRI on 07-07-2020 which showed arthritis but no infection at that time. Patient History Information obtained from Patient. Allergies penicillin, azithromycin Social History Former smoker, Alcohol Use - Never, Drug Use - No History, Caffeine Use - Daily. Medical History Immunological Patient has history of Scleroderma - Lungs and esophagus Musculoskeletal Patient has history of Rheumatoid Arthritis, Osteoarthritis Neurologic Patient has history of Neuropathy - legs, feet, hands Oncologic Patient has history of Received Radiation - Shoulder Medical And Surgical History Notes Respiratory Scleroderma lungs Review of Systems (ROS) Eyes Complains or has symptoms of Vision Changes. Denies complaints or symptoms of Dry Eyes, Glasses /  Contacts. Ear/Nose/Mouth/Throat Denies complaints or symptoms of Difficult clearing ears, Sinusitis. Respiratory O2 2L Integumentary (Skin) Complains or has symptoms of Wounds. Objective Constitutional sitting or standing blood pressure is within target range for patient.. pulse regular and within target range for patient.Marland Kitchen respirations regular, non- labored and within target range for patient.Marland Kitchen temperature within target range for patient.. Well-nourished and well-hydrated in no acute distress. Bracey, Santiago Glad ([id number]) Vitals Time Taken: 1:02 PM, Height: 66 in, Weight: 120 lbs, BMI: 19.4, Temperature: 97.8 F, Pulse: 75 bpm, Respiratory Rate: 16 breaths/min, Blood Pressure: 112/66 mmHg. Eyes conjunctiva clear no eyelid edema noted. pupils equal round and reactive to light and accommodation. Ears, Nose, Mouth, and Throat no gross abnormality of ear auricles or external auditory canals. normal hearing noted during conversation. mucus membranes moist. Respiratory normal breathing without difficulty. Musculoskeletal normal gait and posture. no significant deformity or arthritic changes, no loss or range of motion, no clubbing. Psychiatric this patient is able to make decisions and demonstrates good insight into disease process. Alert and Oriented x 3. pleasant and cooperative. General Notes: Upon inspection patient's wound bed actually showed signs of again bone protruding which was dry and starting to flake to a degree from the left shoulder area in the upper scapular region. Subsequently I do not see anything that appears to be infected obviously but at the same time this bone being dry as it is I think is good to make it extremely difficult to actually be able to heal this area. I think that the only manner in which I can perceive this would happen is if she was able to actually have this debrided away down to healthier bone and then potentially even have a skin graft or some sort  applied. With that being said I am not even certain that she is healthy enough to go through this process to be honest that is outside of my scope. I do think it would be helpful for her to go back to plastic surgery and see what they have to say. Integumentary (Hair, Skin) Wound #1 status is Open. Original cause of wound was Gradually Appeared. The date acquired was:  06/15/2021. The wound is located on the Left Shoulder. The wound measures 1cm length x 1cm width x 0.1cm depth; 0.785cm^2 area and 0.079cm^3 volume. There is bone exposed. There is no tunneling or undermining noted. There is a none present amount of drainage noted. There is no granulation within the wound bed. There is no necrotic tissue within the wound bed. 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 I discussed with the patient today that again I do not see any signs of anything that seems to be infected but at the same time I do see necrotic bone which is protruding from the left shoulder area in the superior scapular region. Again I do not believe that there is can be anything to make wound care perspective but I can do to improve the situation as there really is nothing to grow any tissue over and in fact the bone is protruding beyond the level of the skin as it stands anyway. 2. I did recommend that it would be worthwhile for her to at least discuss this with her plastic surgeon and see if there is anything they think they can do short of having to put her to sleep obviously I do not think that is the likely be an option considering her other chronic medical conditions. We  will see her back as needed in the future if anything changes but as of right now I think that we just cannot perform a consult only today as I do not believe there is any wound care measures that can actually improve her current situation. Electronic Signature(s) TERAH, ROBEY ([id number]) Signed: 12/16/2021 3:58:51 PM By: Worthy Keeler PA-C Entered By: Worthy Keeler on 12/16/2021 15:58:51 Sophia Bean ([id number]) -------------------------------------------------------------------------------- ROS/PFSH Details Patient Name: Sophia Bean Date of Service: 12/13/2021 12:45 PM Medical Record Number: [id number] Patient Account Number: [id number] Date of Birth/Sex: 30-Jul-1960 (61 y.o. F) Treating RN: Cornell Barman Primary Care Provider: SYSTEM, PCP Other Clinician: Referring Provider: Ephriam Jenkins Treating Provider/Extender: Skipper Cliche in Treatment: 0 Information Obtained From Patient Eyes Complaints and Symptoms: Positive for: Vision Changes Negative for: Dry Eyes; Glasses / Contacts Ear/Nose/Mouth/Throat Complaints and Symptoms: Negative for: Difficult clearing ears; Sinusitis Integumentary (Skin) Complaints and Symptoms: Positive for: Wounds Respiratory Complaints and Symptoms: Review of System Notes: O2 2L Medical History: Past Medical History Notes: Scleroderma lungs Immunological Medical History: Positive for: Scleroderma - Lungs and esophagus Musculoskeletal Medical History: Positive for: Rheumatoid Arthritis; Osteoarthritis Neurologic Medical History: Positive for: Neuropathy - legs, feet, hands Oncologic Medical History: Positive for: Received Radiation - Shoulder Immunizations Pneumococcal Vaccine: Received Pneumococcal Vaccination: No Implantable Devices None Family and Social History JAVAE, BRAATEN ([id number]) Former smoker; Alcohol Use: Never; Drug Use: No History; Caffeine Use: Daily Electronic Signature(s) Signed: 12/13/2021 3:46:53 PM By: Gretta Cool, BSN,  RN, CWS, Kim RN, BSN Signed: 12/15/2021 4:35:29 PM By: Worthy Keeler PA-C Entered By: Gretta Cool BSN, RN, CWS, Kim on 12/13/2021 13:40:19 Sophia Bean ([id number]) -------------------------------------------------------------------------------- SuperBill Details Patient Name: Sophia Bean Date of Service: 12/13/2021 Medical Record Number: [id number] Patient Account Number: [id number] Date of Birth/Sex: 04/19/1961 (60 y.o. 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] M34.0 Progressive systemic sclerosis M34.1 CR(E)ST syndrome Facility Procedures CPT4 Code: [id number] Description: [phone] - WOUND CARE VISIT-LEV 2 EST PT Modifier: Quantity: 1 Physician Procedures CPT4 Code: [id number] Description: WC PHYS LEVEL 3 o NEW PT Modifier: Quantity: 1 CPT4 Code: Description: ICD-10 Diagnosis Description U23.536 Basal cell carcinoma of skin of left upper limb, including shoulder 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

## 2022-03-27 ENCOUNTER — Other Ambulatory Visit: Payer: Self-pay | Admitting: Anesthesiology

## 2022-03-27 DIAGNOSIS — M4326 Fusion of spine, lumbar region: Secondary | ICD-10-CM

## 2022-03-27 DIAGNOSIS — M160 Bilateral primary osteoarthritis of hip: Secondary | ICD-10-CM

## 2022-04-03 ENCOUNTER — Encounter (INDEPENDENT_AMBULATORY_CARE_PROVIDER_SITE_OTHER): Payer: Self-pay

## 2022-07-17 ENCOUNTER — Other Ambulatory Visit: Payer: Self-pay | Admitting: Anesthesiology

## 2022-07-17 DIAGNOSIS — M1990 Unspecified osteoarthritis, unspecified site: Secondary | ICD-10-CM

## 2022-07-17 DIAGNOSIS — C4491 Basal cell carcinoma of skin, unspecified: Secondary | ICD-10-CM

## 2022-07-17 DIAGNOSIS — G893 Neoplasm related pain (acute) (chronic): Secondary | ICD-10-CM

## 2022-07-17 DIAGNOSIS — G894 Chronic pain syndrome: Secondary | ICD-10-CM

## 2022-07-31 ENCOUNTER — Ambulatory Visit
Admission: RE | Admit: 2022-07-31 | Discharge: 2022-07-31 | Disposition: A | Discharge status: Home / Self Care | Payer: Medicare Other | Source: Ambulatory Visit | Attending: Anesthesiology | Admitting: Anesthesiology

## 2022-07-31 DIAGNOSIS — G894 Chronic pain syndrome: Secondary | ICD-10-CM | POA: Diagnosis present

## 2022-07-31 DIAGNOSIS — G893 Neoplasm related pain (acute) (chronic): Secondary | ICD-10-CM | POA: Insufficient documentation

## 2022-07-31 DIAGNOSIS — M1990 Unspecified osteoarthritis, unspecified site: Secondary | ICD-10-CM | POA: Diagnosis present

## 2022-07-31 DIAGNOSIS — C4491 Basal cell carcinoma of skin, unspecified: Secondary | ICD-10-CM | POA: Insufficient documentation

## 2022-07-31 MED ORDER — GADOBUTROL 1 MMOL/ML IV SOLN
5.0000 mL | Freq: Once | INTRAVENOUS | Status: AC | PRN
  Administered 2022-07-31: 5 mL via INTRAVENOUS

## 2023-01-31 ENCOUNTER — Ambulatory Visit: Payer: Medicare HMO | Admitting: Physician Assistant

## 2023-01-31 ENCOUNTER — Ambulatory Visit: Payer: Self-pay | Admitting: *Deleted

## 2023-01-31 NOTE — Telephone Encounter (Signed)
A couple of weeks ago I had a bad spell in my left left hip.    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    I can't take a shower when I'm in so much pain.  Pt continued on about multiple issues.  Being on oxygen 24 hours, being on disability, etc.    I had to redirect her to find out what it was she needed/wanted to do.     She had a new pt appt today at 3:00 with Debera Lat, PA at [facility] that she wanted to cancel. I tried to get her to keep the appt since she was c/o pain and so many other issues but she decided to cancel today's appt and let me reschedule her for a later time.  I rescheduled her for 04/05/2023 at 8:20 with Dr. Payton Mccallum to establish as a new pt. With [facility].    Reason for Disposition  Requesting regular office appointment  Answer Assessment - Initial Assessment Questions 1. REASON FOR CALL or QUESTION: "What is your reason for calling today?" or "How can I best help you?" or "What question do you have that I can help answer?"     Pt had a new pt appt for today at 3:00 at [facility] with Debera Lat, PA.    Due to being in pain in her hip along with multiple other issues she wanted to cancel today's appt and reschedule. I cancelled today's 3:00 appt and rescheduled her new pt appt for 04/05/2023 at 8:20 with Dr. Payton Mccallum.  Protocols used: Information Only Call - No Triage-A-AH

## 2023-01-31 NOTE — Telephone Encounter (Signed)
  Chief Complaint: Wanted to cancel new pt appt and reschedule it. Symptoms: Due to having pain in her hip She sees a pain dr.   [name]: N/A Pertinent Negatives: Patient denies wanting to come in today Disposition: [] ED /[] Urgent Care (no appt availability in office) / [x] Appointment(In office/virtual)/ []  [name]/ [] Home Care/ [] Refused Recommended Disposition /[] [facility]/ []  Follow-up with PCP Additional Notes: Cancelled new pt appt with Sophia Lat, PA at [facility] and rescheduled it with Dr. [name] for 10 31, 2024 at 8:20.     No triage needed

## 2023-04-05 ENCOUNTER — Ambulatory Visit: Payer: Medicare HMO | Admitting: Family Medicine

## 2023-04-05 NOTE — Progress Notes (Deleted)
New patient visit   Patient: Sophia Bean   DOB: 08/23/1960   62 y.o. Female  MRN: [id number] Visit Date: 04/05/2023  Today's healthcare provider: Sherlyn Hay, DO   No chief complaint on file.  Subjective    Sophia Bean is a 62 y.o. female who presents today as a new patient to establish care.  HPI   Follows with rheumatology for crest syndrome - Dr. Zetta Bills with endocrinology for osteoporosis - Dr. Tedd Sias 1. 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 I discussed stopping the CellCept due to her skin cancer history and recurrent trouble.   Osteoporosis -on zoledronic acid, calcium and vitamin D  Pulmonology - Dr. Meredeth Ide Moderate copd, ex smoker, on oxygen 24/7 no worse, on no inhalers Prn albuterol Portable oxygen at 2-3 liters with activity her machine caution light is coming on.  ***  Past Medical History:  Diagnosis Date   Anxiety    Arthritis    Cancer (HCC)    skin   Cataract cortical, senile    Chronic respiratory failure (HCC)    COPD (chronic obstructive pulmonary disease) (HCC)    Depression    E coli infection    Osteoporosis, post-menopausal    Peripheral vascular disease (HCC)    PVD (peripheral vascular disease) (HCC)    Raynaud disease    Scleroderma (HCC)    Scoliosis    Scoliosis    Past Surgical History:  Procedure Laterality Date   COLONOSCOPY WITH PROPOFOL N/A  04/05/2020   Procedure: COLONOSCOPY WITH PROPOFOL;  Surgeon: Regis Bill, MD;  Location: [facility] ENDOSCOPY;  Service: Endoscopy;  Laterality: N/A;   ESOPHAGOGASTRODUODENOSCOPY (EGD) WITH PROPOFOL N/A 04/05/2020   Procedure: ESOPHAGOGASTRODUODENOSCOPY (EGD) WITH PROPOFOL;  Surgeon: Regis Bill, MD;  Location: [facility] ENDOSCOPY;  Service: Endoscopy;  Laterality: N/A;   flap conjunctival     mohs Right    SHOULDER ARTHROSCOPY     times 9   toe nail removal     TOOTH EXTRACTION     Family Status  Relation Name Status   Mother  (Not Specified)   Father  (Not Specified)   MGM  (Not Specified)   Neg Hx  (Not Specified)  No partnership data on file   Family History  Problem Relation Age of Onset   Heart disease Mother    Hypertension Mother    Cancer Mother    Cancer Father    Cancer Maternal Grandmother    Heart disease Maternal Grandmother    Breast cancer Neg Hx    Social History   Socioeconomic History   Marital status: Divorced    Spouse name: Not on file   Number of children: Not on file   Years of education: Not on file   Highest education level: 12th grade  Occupational History   Not on file  Tobacco Use   Smoking status: Never   Smokeless tobacco: Never  Vaping Use  Vaping status: Never Used  Substance and Sexual Activity   Alcohol use: No    Alcohol/week: 0.0 standard drinks of alcohol   Drug use: Never   Sexual activity: Not on file  Other Topics Concern   Not on file  Social History Narrative   Not on file   Social Determinants of Health   Financial Resource Strain: Medium Risk (01/31/2023)   Overall Financial Resource Strain (CARDIA)    Difficulty of Paying Living Expenses: Somewhat hard  Food Insecurity: Patient Declined (01/31/2023)   Hunger Vital Sign    Worried About Running Out of Food in the Last Year: Patient declined    Ran Out of Food in the Last Year: Patient declined  Transportation Needs: Unmet Transportation Needs (01/31/2023)    PRAPARE - Administrator, Civil Service (Medical): Yes    Lack of Transportation (Non-Medical): No  Physical Activity: Unknown (01/31/2023)   Exercise Vital Sign    Days of Exercise per Week: 0 days    Minutes of Exercise per Session: Not on file  Stress: No Stress Concern Present (01/31/2023)   [employer] of Occupational Health - Occupational Stress Questionnaire    Feeling of Stress : Not at all  Social Connections: Socially Isolated (01/31/2023)   Social Connection and Isolation Panel [NHANES]    Frequency of Communication with Friends and Family: Three times a week    Frequency of Social Gatherings with Friends and Family: Never    Attends Religious Services: Never    Database administrator or Organizations: No    Attends Engineer, structural: Not on file    Marital Status: Divorced   Outpatient Medications Prior to Visit  Medication Sig   amLODipine (NORVASC) 5 MG tablet Take 5 mg by mouth daily.   apixaban (ELIQUIS) 5 MG TABS tablet Take 2 tablets in the morning and 2 tablets at night for the first seven days. After the first week, take 1 tablet in the morning and 1 tablet at night.   blood glucose meter kit and supplies KIT Dispense based on patient and insurance preference. Use up to four times daily as directed. (FOR ICD-9 250.00, 250.01).   Calcium Carbonate-Vitamin D (CALCIUM HIGH POTENCY/VITAMIN D) 600-200 MG-UNIT TABS Take 1 tablet by mouth 2 (two) times daily.    Cholecalciferol (VITAMIN D-1000 MAX ST) 1000 units tablet Take 1,000 Units by mouth daily.    famotidine (PEPCID) 40 MG tablet Take 40 mg by mouth at bedtime.   fentaNYL (DURAGESIC - DOSED MCG/HR) 12 MCG/HR Place 1 patch onto the skin every other day.    gabapentin (NEURONTIN) 100 MG capsule Take 100 mg by mouth at bedtime. Takes with a 300 mg at bedtime   gabapentin (NEURONTIN) 300 MG capsule Take 300 mg by mouth at bedtime. Takes with a 100 mg at bedtime   mycophenolate (CELLCEPT) 500 MG  tablet Take 1,000 mg by mouth 2 (two) times daily.    nitroGLYCERIN (NITRO-BID) 2 % ointment Place onto the skin.   ondansetron (ZOFRAN) 4 MG tablet Take 1 tablet (4 mg total) by mouth every 6 (six) hours as needed for nausea.   ondansetron (ZOFRAN) 8 MG tablet Take by mouth. (Patient not taking: Reported on 01/03/2020)   pantoprazole (PROTONIX) 40 MG tablet TAKE 1 TABLET ORALLY DAILY IN THE AM 1 HOUR PRIOR TO BREAKFAST   saccharomyces boulardii (FLORASTOR) 250 MG capsule Take 1 capsule (250 mg total) by mouth 2 (two) times daily.   zoledronic  acid (RECLAST) 5 MG/100ML SOLN injection Inject 5 mg into the vein once.   No facility-administered medications prior to visit.   Allergies  Allergen Reactions   Flonase [Fluticasone] Shortness Of Breath and Rash   Minocycline     Other reaction(s): Other (See Comments) She has not been able to eat, has had severe acid reflux, has had some chest pain, and has also had breathing problems.   Azithromycin Hives   Bactrim [Sulfamethoxazole-Trimethoprim]    Morphine And Codeine    Penicillins    Duloxetine Hcl Nausea Only and Palpitations     There is no immunization history on file for this patient.  Health Maintenance  Topic Date Due   Medicare Annual Wellness (AWV)  Never done   COVID-19 Vaccine (1) Never done   Hepatitis C Screening  Never done   DTaP/Tdap/Td (1 - Tdap) Never done   Zoster Vaccines- Shingrix (1 of 2) Never done   Cervical Cancer Screening (HPV/Pap Cotest)  Never done   Lung Cancer Screening  07/27/2017   MAMMOGRAM  11/03/2018   INFLUENZA VACCINE  Never done   Colonoscopy  04/05/2030   HIV Screening  Completed   HPV VACCINES  Aged Out    Patient Care Team: Pcp, No as PCP - General  Review of Systems  {Insert previous labs (optional):23779} {See past labs  Heme  Chem  Endocrine  Serology  Results Review (optional):1}   Objective    LMP  (LMP Unknown)  {Insert last BP/Wt (optional):23777}{See vitals history  (optional):1}   Physical Exam ***  Depression Screen     No data to display         No results found for any visits on 04/05/23.  Assessment & Plan     There are no diagnoses linked to this encounter.   ***  No follow-ups on file.     I discussed the assessment and treatment plan with the patient  The patient was provided an opportunity to ask questions and all were answered. The patient agreed with the plan and demonstrated an understanding of the instructions.   The patient was advised to call back or seek an in-person evaluation if the symptoms worsen or if the condition fails to improve as anticipated.    Sherlyn Hay, DO  [facility] [phone] (phone) [phone] (fax)  [facility]

## 2024-02-13 ENCOUNTER — Encounter: Payer: Self-pay | Admitting: *Deleted

## 2024-03-10 ENCOUNTER — Ambulatory Visit: Admitting: Certified Registered Nurse Anesthetist

## 2024-03-10 ENCOUNTER — Encounter: Payer: Self-pay | Admitting: Gastroenterology

## 2024-03-10 ENCOUNTER — Encounter
Admission: RE | Disposition: A | Discharge status: Home / Self Care | Payer: Self-pay | Source: Home / Self Care | Attending: Gastroenterology

## 2024-03-10 ENCOUNTER — Ambulatory Visit
Admission: RE | Admit: 2024-03-10 | Discharge: 2024-03-10 | Disposition: A | Discharge status: Home / Self Care | Attending: Gastroenterology | Admitting: Gastroenterology

## 2024-03-10 DIAGNOSIS — J449 Chronic obstructive pulmonary disease, unspecified: Secondary | ICD-10-CM | POA: Insufficient documentation

## 2024-03-10 DIAGNOSIS — K219 Gastro-esophageal reflux disease without esophagitis: Secondary | ICD-10-CM | POA: Diagnosis not present

## 2024-03-10 DIAGNOSIS — M349 Systemic sclerosis, unspecified: Secondary | ICD-10-CM | POA: Insufficient documentation

## 2024-03-10 DIAGNOSIS — K2289 Other specified disease of esophagus: Secondary | ICD-10-CM | POA: Diagnosis not present

## 2024-03-10 DIAGNOSIS — I10 Essential (primary) hypertension: Secondary | ICD-10-CM | POA: Diagnosis not present

## 2024-03-10 DIAGNOSIS — R1310 Dysphagia, unspecified: Secondary | ICD-10-CM | POA: Diagnosis present

## 2024-03-10 DIAGNOSIS — K2970 Gastritis, unspecified, without bleeding: Secondary | ICD-10-CM | POA: Insufficient documentation

## 2024-03-10 HISTORY — PX: ESOPHAGOGASTRODUODENOSCOPY: SHX5428

## 2024-03-10 HISTORY — DX: Squamous cell carcinoma of skin of other parts of face: C44.329

## 2024-03-10 LAB — KOH PREP
KOH Prep: NONE SEEN
Special Requests: NORMAL

## 2024-03-10 SURGERY — EGD (ESOPHAGOGASTRODUODENOSCOPY)
Anesthesia: General

## 2024-03-10 MED ORDER — SODIUM CHLORIDE 0.9 % IV SOLN
INTRAVENOUS | Status: DC
  Administered 2024-03-10: 20 mL/h via INTRAVENOUS

## 2024-03-10 MED ORDER — LIDOCAINE HCL (CARDIAC) PF 100 MG/5ML IV SOSY
PREFILLED_SYRINGE | INTRAVENOUS | Status: DC | PRN
  Administered 2024-03-10: 60 mg via INTRAVENOUS

## 2024-03-10 MED ORDER — PROPOFOL 10 MG/ML IV BOLUS
INTRAVENOUS | Status: DC | PRN
  Administered 2024-03-10: 40 mg via INTRAVENOUS

## 2024-03-10 MED ORDER — PROPOFOL 500 MG/50ML IV EMUL
INTRAVENOUS | Status: DC | PRN
  Administered 2024-03-10: 150 ug/kg/min via INTRAVENOUS

## 2024-03-10 NOTE — Anesthesia Procedure Notes (Signed)
 Date/Time: 03/10/2024 10:50 AM  Performed by: Duwayne Craven, CRNAPre-anesthesia Checklist: Patient identified, Emergency Drugs available, Suction available, Patient being monitored and Timeout performed Patient Re-evaluated:Patient Re-evaluated prior to induction Oxygen Delivery Method: Simple face mask Induction Type: IV induction Placement Confirmation: CO2 detector and positive ETCO2

## 2024-03-10 NOTE — Transfer of Care (Signed)
 Immediate Anesthesia Transfer of Care Note  Patient: Sophia Bean  Procedure(s) Performed: EGD (ESOPHAGOGASTRODUODENOSCOPY)  Patient Location: PACU  Anesthesia Type:General  Level of Consciousness: drowsy  Airway & Oxygen Therapy: Patient Spontanous Breathing  Post-op Assessment: Report given to RN and Post -op Vital signs reviewed and stable  Post vital signs: Reviewed and stable  Last Vitals:  Vitals Value Taken Time  BP    Temp    Pulse    Resp    SpO2      Last Pain:  Vitals:   03/10/24 1000  TempSrc: Temporal  PainSc: 0-No pain         Complications: No notable events documented.

## 2024-03-10 NOTE — Anesthesia Preprocedure Evaluation (Signed)
 Anesthesia Evaluation  Patient identified by MRN, date of birth, ID band Patient awake    Reviewed: Allergy & Precautions, H&P , NPO status , Patient's Chart, lab work & pertinent test results, reviewed documented beta blocker date and time   Airway Mallampati: II   Neck ROM: full    Dental  (+) Poor Dentition   Pulmonary COPD   Pulmonary exam normal        Cardiovascular negative cardio ROS Normal cardiovascular exam Rhythm:regular Rate:Normal     Neuro/Psych  PSYCHIATRIC DISORDERS Anxiety Depression    negative neurological ROS     GI/Hepatic negative GI ROS, Neg liver ROS,,,  Endo/Other  negative endocrine ROS    Renal/GU negative Renal ROS  negative genitourinary   Musculoskeletal   Abdominal   Peds  Hematology negative hematology ROS (+)   Anesthesia Other Findings Past Medical History: No date: Anxiety No date: Arthritis No date: Cancer (HCC)     Comment:  skin No date: Cataract cortical, senile No date: Chronic respiratory failure (HCC) No date: COPD (chronic obstructive pulmonary disease) (HCC) No date: Depression No date: E coli infection No date: Osteoporosis, post-menopausal No date: Peripheral vascular disease No date: PVD (peripheral vascular disease) No date: Raynaud disease No date: Scleroderma (HCC) No date: Scoliosis No date: Squamous cell carcinoma of skin of left cheek Past Surgical History: 04/05/2020: COLONOSCOPY WITH PROPOFOL ; N/A     Comment:  Procedure: COLONOSCOPY WITH PROPOFOL ;  Surgeon:               Maryruth Ole DASEN, MD;  Location: [facility] ENDOSCOPY;                Service: Endoscopy;  Laterality: N/A; 04/05/2020: ESOPHAGOGASTRODUODENOSCOPY (EGD) WITH PROPOFOL ; N/A     Comment:  Procedure: ESOPHAGOGASTRODUODENOSCOPY (EGD) WITH               PROPOFOL ;  Surgeon: Maryruth Ole DASEN, MD;  Location:               [facility] ENDOSCOPY;  Service: Endoscopy;  Laterality: N/A; No date:  flap conjunctival No date: mohs; Right No date: SHOULDER ARTHROSCOPY     Comment:  times 9 No date: toe nail removal No date: TOOTH EXTRACTION   Reproductive/Obstetrics negative OB ROS                              Anesthesia Physical Anesthesia Plan  ASA: 3  Anesthesia Plan: General   Post-op Pain Management:    Induction:   PONV Risk Score and Plan:   Airway Management Planned:   Additional Equipment:   Intra-op Plan:   Post-operative Plan:   Informed Consent: I have reviewed the patients History and Physical, chart, labs and discussed the procedure including the risks, benefits and alternatives for the proposed anesthesia with the patient or authorized representative who has indicated his/her understanding and acceptance.     Dental Advisory Given  Plan Discussed with: CRNA  Anesthesia Plan Comments:         Anesthesia Quick Evaluation

## 2024-03-10 NOTE — Interval H&P Note (Signed)
 History and Physical Interval Note:  03/10/2024 10:46 AM  Sophia Bean  has presented today for surgery, with the diagnosis of GERD,dysphagia.  The various methods of treatment have been discussed with the patient and family. After consideration of risks, benefits and other options for treatment, the patient has consented to  Procedure(s): EGD (ESOPHAGOGASTRODUODENOSCOPY) (N/A) as a surgical intervention.  The patient's history has been reviewed, patient examined, no change in status, stable for surgery.  I have reviewed the patient's chart and labs.  Questions were answered to the patient's satisfaction.     Ole ONEIDA Schick  Ok to proceed with EGD

## 2024-03-10 NOTE — Op Note (Signed)
 [facility] Patient Name: Sophia Bean Procedure Date: 03/10/2024 10:17 AM MRN: [id number] Account #: [id number] Date of Birth: [birth year] Admit Type: Outpatient Age: 63 Room: [facility] ENDO ROOM 3 Gender: Female Note Status: Finalized Instrument Name: Barnie GI Scope [id number] Procedure:             Upper GI endoscopy Indications:           Dysphagia, Gastro-esophageal reflux disease Providers:             Ole Schick MD, MD Referring MD:          Ole Schick MD, MD (Referring MD) Medicines:             Monitored Anesthesia Care Complications:         No immediate complications. Estimated blood loss:                         Minimal. Procedure:             Pre-Anesthesia Assessment:                        - Prior to the procedure, a History and Physical was                         performed, and patient medications and allergies were                         reviewed. The patient is competent. The risks and                         benefits of the procedure and the sedation options and                         risks were discussed with the patient. All questions                         were answered and informed consent was obtained.                         Patient identification and proposed procedure were                         verified by the physician, the nurse, the                         anesthesiologist, the anesthetist and the technician                         in the endoscopy suite. Mental Status Examination:                         alert and oriented. Airway Examination: normal                         oropharyngeal airway and neck mobility. Respiratory                         Examination: clear to auscultation. CV Examination:  normal. Prophylactic Antibiotics: The patient does not                         require prophylactic antibiotics. Prior                         Anticoagulants: The patient has taken no  anticoagulant                         or antiplatelet agents. ASA Grade Assessment: III - A                         patient with severe systemic disease. After reviewing                         the risks and benefits, the patient was deemed in                         satisfactory condition to undergo the procedure. The                         anesthesia plan was to use monitored anesthesia care                         (MAC). Immediately prior to administration of                         medications, the patient was re-assessed for adequacy                         to receive sedatives. The heart rate, respiratory                         rate, oxygen saturations, blood pressure, adequacy of                         pulmonary ventilation, and response to care were                         monitored throughout the procedure. The physical                         status of the patient was re-assessed after the                         procedure.                        After obtaining informed consent, the endoscope was                         passed under direct vision. Throughout the procedure,                         the patient's blood pressure, pulse, and oxygen                         saturations were monitored continuously. The Endoscope  was introduced through the mouth, and advanced to the                         second part of duodenum. The upper GI endoscopy was                         accomplished without difficulty. The patient tolerated                         the procedure well. Findings:      White nummular lesions were noted in the entire esophagus. Brushings for       KOH prep were obtained. Estimated blood loss was minimal.      Patchy mild inflammation characterized by erythema was found in the       gastric antrum. Biopsies were taken with a cold forceps for histology.       Estimated blood loss was minimal.      The examined duodenum was  normal. Impression:            - White nummular lesions in esophageal mucosa.                         Brushings performed.                        - Gastritis. Biopsied.                        - Normal examined duodenum. Recommendation:        - Discharge patient to home.                        - Resume previous diet.                        - Continue present medications.                        - Await pathology results.                        - Return to referring physician as previously                         scheduled. Procedure Code(s):     --- Professional ---                        [phone], Esophagogastroduodenoscopy, flexible,                         transoral; with biopsy, single or multiple Diagnosis Code(s):     --- Professional ---                        K22.89, Other specified disease of esophagus                        K29.70, Gastritis, unspecified, without bleeding                        R13.10, Dysphagia, unspecified  K21.9, Gastro-esophageal reflux disease without                         esophagitis CPT copyright 2022 American Medical Association. All rights reserved. The codes documented in this report are preliminary and upon coder review may  be revised to meet current compliance requirements. Ole Schick MD, MD 03/10/2024 11:06:05 AM Number of Addenda: 0 Note Initiated On: 03/10/2024 10:17 AM Estimated Blood Loss:  Estimated blood loss was minimal.      [facility]

## 2024-03-10 NOTE — H&P (Signed)
 Outpatient short stay form Pre-procedure 03/10/2024  Ole ONEIDA Schick, MD  Primary Physician: Patient, No Pcp Per  Reason for visit:  Dysphagia/GERD  History of present illness:    63 y/o lady with history of scleroderma, hypertension, COPD, and arthritis here for EGD for GERD/Dysphagia. No blood thinners. No neck surgeries.    Current Facility-Administered Medications:    0.9 %  sodium chloride  infusion, , Intravenous, Continuous, Burke Terry, Ole ONEIDA, MD, Last Rate: 20 mL/hr at 03/10/24 1022, Continued from Pre-op at 03/10/24 1022  Medications Prior to Admission  Medication Sig Dispense Refill Last Dose/Taking   amLODipine (NORVASC) 5 MG tablet Take 5 mg by mouth daily.   03/09/2024   blood glucose meter kit and supplies KIT Dispense based on patient and insurance preference. Use up to four times daily as directed. (FOR ICD-9 250.00, 250.01). 1 each 0 03/09/2024   Calcium Carbonate-Vitamin D (CALCIUM HIGH POTENCY/VITAMIN D) 600-200 MG-UNIT TABS Take 1 tablet by mouth 2 (two) times daily.    03/09/2024   Cholecalciferol (VITAMIN D-1000 MAX ST) 1000 units tablet Take 1,000 Units by mouth daily.    03/09/2024   famotidine  (PEPCID ) 40 MG tablet Take 40 mg by mouth at bedtime.   03/09/2024   gabapentin  (NEURONTIN ) 100 MG capsule Take 100 mg by mouth at bedtime. Takes with a 300 mg at bedtime   03/09/2024   gabapentin  (NEURONTIN ) 300 MG capsule Take 300 mg by mouth at bedtime. Takes with a 100 mg at bedtime  2 03/09/2024   mycophenolate  (CELLCEPT ) 500 MG tablet Take 1,000 mg by mouth 2 (two) times daily.    03/09/2024   ondansetron  (ZOFRAN ) 4 MG tablet Take 1 tablet (4 mg total) by mouth every 6 (six) hours as needed for nausea. 20 tablet 0 03/09/2024   pantoprazole  (PROTONIX ) 40 MG tablet TAKE 1 TABLET ORALLY DAILY IN THE AM 1 HOUR PRIOR TO BREAKFAST  3 03/09/2024   saccharomyces boulardii (FLORASTOR) 250 MG capsule Take 1 capsule (250 mg total) by mouth 2 (two) times daily. 60 capsule 4 03/09/2024    apixaban  (ELIQUIS ) 5 MG TABS tablet Take 2 tablets in the morning and 2 tablets at night for the first seven days. After the first week, take 1 tablet in the morning and 1 tablet at night. (Patient not taking: Reported on 03/10/2024) 88 tablet 0 Not Taking   fentaNYL  (DURAGESIC  - DOSED MCG/HR) 12 MCG/HR Place 1 patch onto the skin every other day.   0    nitroGLYCERIN (NITRO-BID) 2 % ointment Place onto the skin.      ondansetron  (ZOFRAN ) 8 MG tablet Take by mouth. (Patient not taking: Reported on 01/03/2020)      zoledronic acid (RECLAST) 5 MG/100ML SOLN injection Inject 5 mg into the vein once.        Allergies  Allergen Reactions   Flonase [Fluticasone] Shortness Of Breath and Rash   Minocycline     Other reaction(s): Other (See Comments) She has not been able to eat, has had severe acid reflux, has had some chest pain, and has also had breathing problems.   Azithromycin Hives   Bactrim [Sulfamethoxazole-Trimethoprim]    Morphine And Codeine    Penicillins    Duloxetine Hcl Nausea Only and Palpitations   Nifedipine Palpitations     Past Medical History:  Diagnosis Date   Anxiety    Arthritis    Cancer (HCC)    skin   Cataract cortical, senile    Chronic respiratory failure (HCC)  COPD (chronic obstructive pulmonary disease) (HCC)    Depression    E coli infection    Osteoporosis, post-menopausal    Peripheral vascular disease    PVD (peripheral vascular disease)    Raynaud disease    Scleroderma (HCC)    Scoliosis    Squamous cell carcinoma of skin of left cheek     Review of systems:  Otherwise negative.    Physical Exam  Gen: Alert, oriented. Appears stated age.  HEENT: PERRLA. Lungs: No respiratory distress CV: RRR Abd: soft, benign, no masses Ext: No edema    Planned procedures: Proceed with EGD. The patient understands the nature of the planned procedure, indications, risks, alternatives and potential complications including but not limited to  bleeding, infection, perforation, damage to internal organs and possible oversedation/side effects from anesthesia. The patient agrees and gives consent to proceed.  Please refer to procedure notes for findings, recommendations and patient disposition/instructions.     Ole ONEIDA Schick, MD [facility]

## 2024-03-11 LAB — SURGICAL PATHOLOGY

## 2024-03-17 NOTE — Anesthesia Postprocedure Evaluation (Signed)
 Anesthesia Post Note  Patient: Sophia Bean  Procedure(s) Performed: EGD (ESOPHAGOGASTRODUODENOSCOPY)  Patient location during evaluation: PACU Anesthesia Type: General Level of consciousness: awake and alert Pain management: pain level controlled Vital Signs Assessment: post-procedure vital signs reviewed and stable Respiratory status: spontaneous breathing, nonlabored ventilation, respiratory function stable and patient connected to nasal cannula oxygen Cardiovascular status: blood pressure returned to baseline and stable Postop Assessment: no apparent nausea or vomiting Anesthetic complications: no   No notable events documented.   Last Vitals:  Vitals:   03/10/24 1113 03/10/24 1122  BP: 127/74 (!) 142/75  Pulse: 73 66  Resp: 18 18  Temp:    SpO2: 100% 100%    Last Pain:  Vitals:   03/10/24 1122  TempSrc:   PainSc: 0-No pain                 Lynwood KANDICE Clause
